# Patient Record
Sex: Male | Born: 2000 | Race: White | Hispanic: No | Marital: Single | Smoking: Current every day smoker
Health system: Southern US, Community
[De-identification: ages and names within clinical notes are randomized; demographics above are authoritative.]

## PROBLEM LIST (undated history)

## (undated) DIAGNOSIS — F1290 Cannabis use, unspecified, uncomplicated: Secondary | ICD-10-CM

## (undated) DIAGNOSIS — J45909 Unspecified asthma, uncomplicated: Secondary | ICD-10-CM

## (undated) DIAGNOSIS — L309 Dermatitis, unspecified: Secondary | ICD-10-CM

## (undated) DIAGNOSIS — K4090 Unilateral inguinal hernia, without obstruction or gangrene, not specified as recurrent: Secondary | ICD-10-CM

## (undated) DIAGNOSIS — S62109A Fracture of unspecified carpal bone, unspecified wrist, initial encounter for closed fracture: Secondary | ICD-10-CM

## (undated) HISTORY — PX: NOSE SURGERY: SHX723

## (undated) HISTORY — DX: Unspecified asthma, uncomplicated: J45.909

---
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
## 2019-05-24 IMAGING — DX DG TIBIA/FIBULA 2V*L*
2 series · 2 of 2 positions shown · non-contrast
Comparison: 01/10/2014.

CLINICAL DATA: Swelling.  Evaluate for stress fracture.

EXAM:
LEFT TIBIA AND FIBULA - 2 VIEW

[tibia ap]
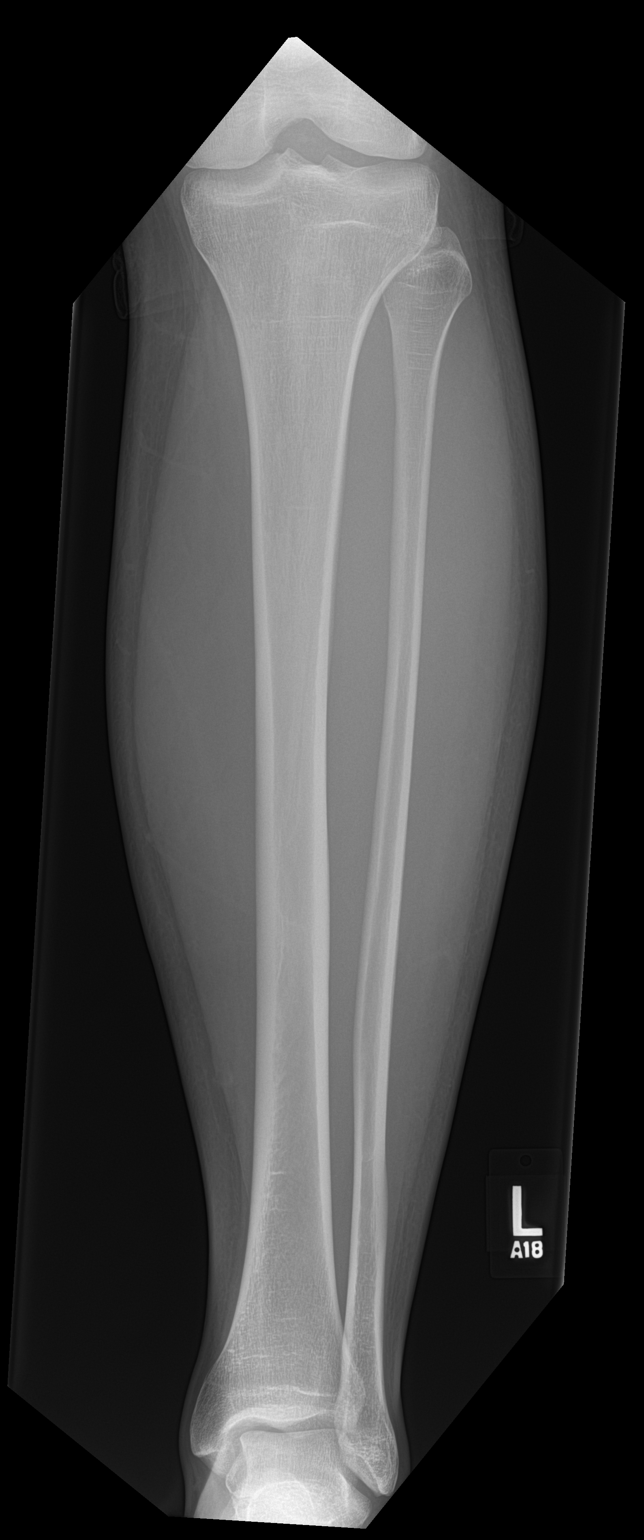

[tibia lat]
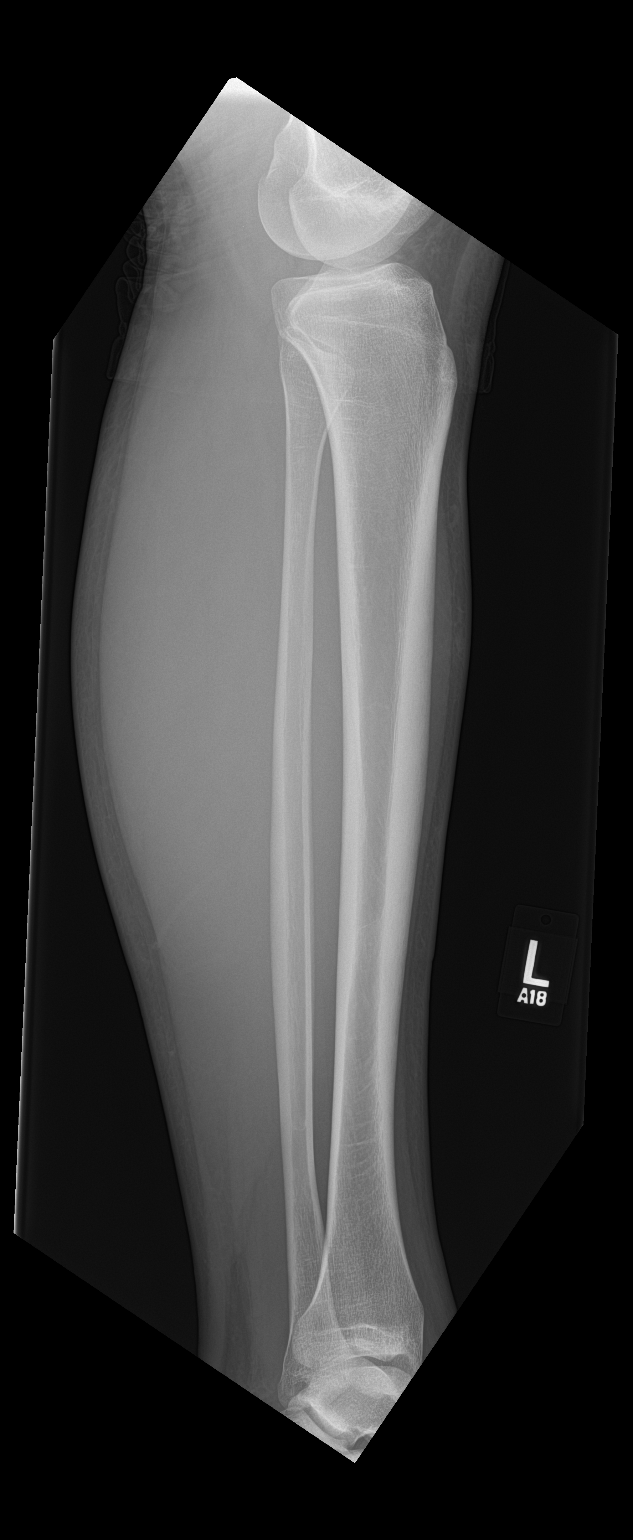

[2 of 2 positions shown; findings below may reference images not displayed]

FINDINGS: No acute bony or joint abnormality identified. No evidence of
fracture or dislocation. No focal bony abnormality. No radiopaque
foreign body.
IMPRESSION: No acute abnormality.

## 2019-05-31 ENCOUNTER — Ambulatory Visit
Admission: EM | Admit: 2019-05-31 | Discharge: 2019-05-31 | Disposition: A | Discharge status: Home / Self Care | Attending: Emergency Medicine | Admitting: Emergency Medicine

## 2019-05-31 ENCOUNTER — Ambulatory Visit (INDEPENDENT_AMBULATORY_CARE_PROVIDER_SITE_OTHER)

## 2019-05-31 ENCOUNTER — Other Ambulatory Visit: Payer: Self-pay

## 2019-05-31 DIAGNOSIS — R05 Cough: Secondary | ICD-10-CM

## 2019-05-31 DIAGNOSIS — J4521 Mild intermittent asthma with (acute) exacerbation: Secondary | ICD-10-CM

## 2019-05-31 DIAGNOSIS — Z720 Tobacco use: Secondary | ICD-10-CM

## 2019-05-31 DIAGNOSIS — R0602 Shortness of breath: Secondary | ICD-10-CM

## 2019-05-31 MED ORDER — PREDNISONE 20 MG PO TABS: 20.0000 mg | ORAL_TABLET | Freq: Two times a day (BID) | ORAL | 0 refills | Status: AC

## 2019-05-31 NOTE — ED Triage Notes (Signed)
Pt states that he has had sob with right chest discomfort, tested for covid on Friday and was negative

## 2019-05-31 NOTE — Discharge Instructions (Signed)
We are unable to rule out cardiac cause or blood clot in the Urgent care setting at this time.  If you are concerned that something is wrong with your heart or that you have a blood clot you should go to the ED for further work-up and evaluation.   X-rays did not show lung collapse and showed good lung markings in right lung Exam and vital signs were reassuring on exam Symptoms may be secondary to underlying asthma, smoking, and/or anxiety We will treat you possible asthma flare based on your symptoms Continue with rescue inhaler as needed for shortness of breath and/or wheezing Prednisone prescribed.  Take as directed and to completion Get plenty of rest and push fluids Please follow up with your PCP next week for recheck and to ensure your symptoms are improving.  If you are struggling with nicotine addiction you should talk to your doctor about nicotine patches or medications that may help you quit.  Smoking tobacco may put you at risk for developing lung cancer later in life.  It may also make your asthma worse.   Return or go to ER if you have any new or worsening symptoms such as fever, chills, fatigue, worsening shortness of breath, wheezing, chest pain, nausea, vomiting, changes in bowel or bladder habits, etc..Marland Kitchen

## 2019-05-31 NOTE — ED Provider Notes (Signed)
[place]   [id number] 05/31/19 Arrival Time: 2355  Cc:  SOB  SUBJECTIVE:  Leon Farmer is a 18 y.o. male hx significant for asthma, and tobacco use, smokes 3 black-n-milds/ per day x 2 years, who presents with intermittent subjective SOB with activity and his right lung "not feeling right," x 4-5 days. Does admit to smoking marijuana prior to symptoms, denies other recreational drug use. Denies sick exposure to COVID, flu or strep.  Denies recent travel.  Was tested for COVID on Friday, which was negative.  Has been using rescue inhaler with relief.  Symptoms are made worse at night and with activity.   Denies previous symptoms in the past.  Complains of associated wheezing.  Denies fever, chills, fatigue, sinus pain, congestion, rhinorrhea, sore throat, chest pain, nausea, changes in bowel or bladder habits.    Denies hx of blood clots, recent long travel, calf pain, hormone or steroid use.    ROS: As per HPI.  Past Medical History:  Diagnosis Date  . Asthma   . Asthma   . Fracture of wrist    Past Surgical History:  Procedure Laterality Date  . NOSE SURGERY     Allergies  Allergen Reactions  . Shrimp [Shellfish Allergy]    No current facility-administered medications on file prior to encounter.    Current Outpatient Medications on File Prior to Encounter  Medication Sig Dispense Refill  . albuterol (PROVENTIL HFA;VENTOLIN HFA) 108 (90 BASE) MCG/ACT inhaler Inhale 2 puffs into the lungs every 6 (six) hours as needed. Shortness of breath    . albuterol (PROVENTIL) (2.5 MG/3ML) 0.083% nebulizer solution Take 2.5 mg by nebulization every 6 (six) hours as needed. For shortness of breath       Social History   Socioeconomic History  . Marital status: Single    Spouse name: Not on file  . Number of children: Not on file  . Years of education: Not on file  . Highest education level: Not on file  Occupational History  . Not on file  Social Needs  . Financial  resource strain: Not on file  . Food insecurity    Worry: Not on file    Inability: Not on file  . Transportation needs    Medical: Not on file    Non-medical: Not on file  Tobacco Use  . Smoking status: Current Some Day Smoker  . Smokeless tobacco: Never Used  Substance and Sexual Activity  . Alcohol use: No  . Drug use: No  . Sexual activity: Not on file  Lifestyle  . Physical activity    Days per week: Not on file    Minutes per session: Not on file  . Stress: Not on file  Relationships  . Social Herbalist on phone: Not on file    Gets together: Not on file    Attends religious service: Not on file    Active member of club or organization: Not on file    Attends meetings of clubs or organizations: Not on file    Relationship status: Not on file  . Intimate partner violence    Fear of current or ex partner: Not on file    Emotionally abused: Not on file    Physically abused: Not on file    Forced sexual activity: Not on file  Other Topics Concern  . Not on file  Social History Narrative  . Not on file   Family History  Problem Relation  Age of Onset  . Heart disease Other   . Diabetes Other      OBJECTIVE:  Vitals:   05/31/19 1509  BP: 138/81  Pulse: 85  Resp: 20  Temp: 98.3 F (36.8 C)  SpO2: 96%    General appearance: AOx3 to person, place and recent holiday, well-appearing, nontoxic; speaking in full sentences without difficulty HEENT:NCAT; Ears: EACs clear, TMs pearly gray; Eyes: PERRL.  EOM grossly intact. Nose: nares patent without rhinorrhea; Throat: tonsils nonerythematous or enlarged, uvula midline  Neck: supple without LAD Lungs: clear to auscultation bilaterally without adventitious breath sounds; normal respiratory effort; cough absent; good breath sounds throughout bilateral lung fields Heart: regular rate and rhythm.  Radial pulses 2+ symmetrical bilaterally Skin: warm and dry Psychological: alert and cooperative; anxious mood and  affect; pressured speech  DIAGNOSTIC STUDIES:  Dg Chest 2 View  Result Date: 05/31/2019 CLINICAL DATA:  Cough and shortness of breath for 6 days. EXAM: CHEST - 2 VIEW COMPARISON:  PA and lateral chest 11/03/2010. FINDINGS: The lungs are clear. Heart size is normal. No pneumothorax or pleural fluid. No bony abnormality. IMPRESSION: Normal chest. Electronically Signed   By: Drusilla Kannerhomas  Dalessio M.D.   On: 05/31/2019 16:12     ASSESSMENT & PLAN:  1. Mild intermittent asthma with exacerbation   2. Shortness of breath     Meds ordered this encounter  Medications  . predniSONE (DELTASONE) 20 MG tablet    Sig: Take 1 tablet (20 mg total) by mouth 2 (two) times daily with a meal for 5 days.    Dispense:  10 tablet    Refill:  0    Order Specific Question:   Supervising Provider    Answer:   Eustace MooreELSON, YVONNE SUE [[id number]]    Orders Placed This Encounter  Procedures  . DG Chest 2 View    Standing Status:   Standing    Number of Occurrences:   1    Order Specific Question:   Reason for Exam (SYMPTOM  OR DIAGNOSIS REQUIRED)    Answer:   subjective SOB x 6 days    We are unable to rule out cardiac cause or blood clot in the Urgent care setting at this time.  If you are concerned that something is wrong with your heart or that you have a blood clot you should go to the ED for further work-up and evaluation.   X-rays did not show lung collapse and showed good lung markings in right lung Exam and vital signs were reassuring on exam Symptoms may be secondary to underlying asthma, smoking, and/or anxiety We will treat you for possible asthma flare based on your symptoms Continue with rescue inhaler as needed for shortness of breath and/or wheezing Prednisone prescribed.  Take as directed and to completion Get plenty of rest and push fluids Please follow up with your PCP next week for recheck and to ensure your symptoms are improving.  If you are struggling with nicotine addiction you should talk to  your doctor about nicotine patches or medications that may help you quit.  Smoking tobacco may put you at risk for developing lung cancer later in life.  It may also make your asthma worse.   Return or go to ER if you have any new or worsening symptoms such as fever, chills, fatigue, worsening shortness of breath, wheezing, chest pain, nausea, vomiting, changes in bowel or bladder habits, etc...  Reviewed expectations re: course of current medical issues. Questions answered. Outlined signs  and symptoms indicating need for more acute intervention. Patient verbalized understanding. After Visit Summary given.          Rennis HardingWurst, Shereta Crothers, PA-C 05/31/19 1630

## 2021-07-27 ENCOUNTER — Other Ambulatory Visit: Payer: Self-pay

## 2021-07-27 ENCOUNTER — Encounter: Payer: Self-pay | Admitting: *Deleted

## 2021-07-27 ENCOUNTER — Emergency Department
Admission: EM | Admit: 2021-07-27 | Discharge: 2021-07-27 | Disposition: A | Discharge status: Home / Self Care | Payer: Self-pay | Attending: Emergency Medicine | Admitting: Emergency Medicine

## 2021-07-27 DIAGNOSIS — R1032 Left lower quadrant pain: Secondary | ICD-10-CM | POA: Insufficient documentation

## 2021-07-27 DIAGNOSIS — R0789 Other chest pain: Secondary | ICD-10-CM | POA: Insufficient documentation

## 2021-07-27 DIAGNOSIS — R110 Nausea: Secondary | ICD-10-CM | POA: Insufficient documentation

## 2021-07-27 DIAGNOSIS — F17200 Nicotine dependence, unspecified, uncomplicated: Secondary | ICD-10-CM | POA: Insufficient documentation

## 2021-07-27 DIAGNOSIS — J45909 Unspecified asthma, uncomplicated: Secondary | ICD-10-CM | POA: Insufficient documentation

## 2021-07-27 LAB — CBC
HCT: 43.5 % (ref 39.0–52.0)
Hemoglobin: 15.7 g/dL (ref 13.0–17.0)
MCH: 35.1 pg — ABNORMAL HIGH (ref 26.0–34.0)
MCHC: 36.1 g/dL — ABNORMAL HIGH (ref 30.0–36.0)
MCV: 97.3 fL (ref 80.0–100.0)
Platelets: 205 10*3/uL (ref 150–400)
RBC: 4.47 MIL/uL (ref 4.22–5.81)
RDW: 11.7 % (ref 11.5–15.5)
WBC: 6.4 10*3/uL (ref 4.0–10.5)
nRBC: 0 % (ref 0.0–0.2)

## 2021-07-27 LAB — COMPREHENSIVE METABOLIC PANEL
ALT: 24 U/L (ref 0–44)
AST: 23 U/L (ref 15–41)
Albumin: 4.9 g/dL (ref 3.5–5.0)
Alkaline Phosphatase: 66 U/L (ref 38–126)
Anion gap: 8 (ref 5–15)
BUN: 15 mg/dL (ref 6–20)
CO2: 26 mmol/L (ref 22–32)
Calcium: 9.5 mg/dL (ref 8.9–10.3)
Chloride: 102 mmol/L (ref 98–111)
Creatinine, Ser: 0.9 mg/dL (ref 0.61–1.24)
GFR, Estimated: >60 mL/min (ref >60)
Glucose, Bld: 120 mg/dL — ABNORMAL HIGH (ref 70–99)
Potassium: 3.8 mmol/L (ref 3.5–5.1)
Sodium: 136 mmol/L (ref 135–145)
Total Bilirubin: 0.9 mg/dL (ref 0.3–1.2)
Total Protein: 8.1 g/dL (ref 6.5–8.1)

## 2021-07-27 LAB — URINALYSIS, COMPLETE (UACMP) WITH MICROSCOPIC
Bilirubin Urine: NEGATIVE
Glucose, UA: NEGATIVE mg/dL
Hgb urine dipstick: NEGATIVE
Ketones, ur: NEGATIVE mg/dL
Leukocytes,Ua: NEGATIVE
Nitrite: NEGATIVE
Protein, ur: NEGATIVE mg/dL
Specific Gravity, Urine: 1.015 (ref 1.005–1.030)
pH: 6.5 (ref 5.0–8.0)

## 2021-07-27 LAB — LIPASE, BLOOD: Lipase: 27 U/L (ref 11–51)

## 2021-07-27 NOTE — ED Triage Notes (Signed)
Patient c/o abdominal pain that has been present for 3 days. Patient also c/o swelling in groin and left side chest that he is concerned that it could a hernia. Patient c/o pain with urination.

## 2021-07-27 NOTE — ED Provider Notes (Signed)
[facility] Emergency Department Provider Note   ____________________________________________   Event Date/Time   First MD Initiated Contact with Patient 07/27/21 2001     (approximate)  I have reviewed the triage vital signs and the nursing notes.   HISTORY  Chief Complaint Abdominal Pain    HPI Leon Farmer is a 20 y.o. male who presents for left-sided chest wall pain and left inguinal pain that began earlier today  LOCATION: Left lower chest wall and left inguinal region DURATION: Began earlier today TIMING: Resolved since onset SEVERITY: Acute, severe QUALITY: Aching/tingling in the chest CONTEXT: Patient states that he was lifting heavy bus trays at work when he noticed a sharp pain to the left inguinal region and felt a small mass that was easily reducible and did not recur.  Patient states that approximately 1 hour later he began felt a mass to the left lower anterior chest wall that he pressed on and went away but subsequently gave him 10/10 tingling sensation and pain over this area MODIFYING FACTORS: Palpation worsens these pains and time alleviated them ASSOCIATED SYMPTOMS: Mild nausea   Per medical record review patient has history of asthma          Past Medical History:  Diagnosis Date   Asthma    Asthma    Fracture of wrist     Patient Active Problem List   Diagnosis Date Noted   Distal radius fracture 10/02/2014   Contusion of knee, right 04/06/2014    Past Surgical History:  Procedure Laterality Date   NOSE SURGERY      Prior to Admission medications   Medication Sig Start Date End Date Taking? Authorizing Provider  albuterol (PROVENTIL HFA;VENTOLIN HFA) 108 (90 BASE) MCG/ACT inhaler Inhale 2 puffs into the lungs every 6 (six) hours as needed. Shortness of breath    [provider]  albuterol (PROVENTIL) (2.5 MG/3ML) 0.083% nebulizer solution Take 2.5 mg by nebulization every 6 (six) hours as needed.  For shortness of breath     [provider]    Allergies Shrimp [shellfish allergy]  Family History  Problem Relation Age of Onset   Heart disease Other    Diabetes Other     Social History Social History   Tobacco Use   Smoking status: Some Days   Smokeless tobacco: Never  Substance Use Topics   Alcohol use: No   Drug use: Yes    Types: Marijuana    Review of Systems Constitutional: No fever/chills Eyes: No visual changes. ENT: No sore throat. Cardiovascular: Endorses chest pain. Respiratory: Denies shortness of breath. Gastrointestinal: Endorses left inguinal abdominal pain.  No nausea, no vomiting.  No diarrhea. Genitourinary: Negative for dysuria. Musculoskeletal: Negative for acute arthralgias Skin: Negative for rash. Neurological: Negative for headaches, weakness/numbness/paresthesias in any extremity Psychiatric: Negative for suicidal ideation/homicidal ideation   ____________________________________________   PHYSICAL EXAM:  VITAL SIGNS: ED Triage Vitals  Enc Vitals Group     BP 07/27/21 1807 126/74     Pulse Rate 07/27/21 1807 71     Resp 07/27/21 1807 16     Temp 07/27/21 1807 97.9 F (36.6 C)     Temp Source 07/27/21 1807 Oral     SpO2 07/27/21 1807 98 %     Weight 07/27/21 1809 200 lb (90.7 kg)     Height 07/27/21 1809 6\' 2"  (1.88 m)     Head Circumference --      Peak Flow --  Pain Score 07/27/21 1808 4     Pain Loc --      Pain Edu? --      Excl. in GC? --    Constitutional: Alert and oriented. Well appearing and in no acute distress. Eyes: Conjunctivae are normal. PERRL. Head: Atraumatic. Nose: No congestion/rhinnorhea. Mouth/Throat: Mucous membranes are moist. Neck: No stridor Cardiovascular: Grossly normal heart sounds.  Good peripheral circulation. Respiratory: Normal respiratory effort.  No retractions. Gastrointestinal: Soft and nontender. No distention.  No obvious inguinal hernia Musculoskeletal: No obvious  deformities Neurologic:  Normal speech and language. No gross focal neurologic deficits are appreciated. Skin:  Skin is warm and dry. No rash noted. Psychiatric: Mood and affect are normal. Speech and behavior are normal.  ____________________________________________   LABS (all labs ordered are listed, but only abnormal results are displayed)  Labs Reviewed  COMPREHENSIVE METABOLIC PANEL - Abnormal; Notable for the following components:      Result Value   Glucose, Bld 120 (*)    All other components within normal limits  CBC - Abnormal; Notable for the following components:   MCH 35.1 (*)    MCHC 36.1 (*)    All other components within normal limits  URINALYSIS, COMPLETE (UACMP) WITH MICROSCOPIC - Abnormal; Notable for the following components:   Bacteria, UA RARE (*)    All other components within normal limits  LIPASE, BLOOD    PROCEDURES  Procedure(s) performed (including Critical Care):  .1-3 Lead EKG Interpretation  Date/Time: 07/27/2021 10:04 PM Performed by: Merwyn Katos, MD Authorized by: Merwyn Katos, MD     Interpretation: normal     ECG rate:  71   ECG rate assessment: normal     Rhythm: sinus rhythm     Ectopy: none     Conduction: normal     ____________________________________________   INITIAL IMPRESSION / ASSESSMENT AND PLAN / ED COURSE  As part of my medical decision making, I reviewed the following data within the electronic medical record, if available:  Nursing notes reviewed and incorporated, Labs reviewed, EKG interpreted, Old chart reviewed, Radiograph reviewed and Notes from prior ED visits reviewed and incorporated      Patient presents for abdominal pain.  Differential diagnosis includes appendicitis, abdominal aortic aneurysm, surgical biliary disease, pancreatitis, SBO, mesenteric ischemia, serious intra-abdominal bacterial illness, genital torsion. Doubt atypical ACS. Based on history, physical exam, radiologic/laboratory  evaluation, there is no red flag results or symptomatology requiring emergent intervention or need for admission at this time Pt tolerating PO. Disposition: Patient will be discharged with strict return precautions and follow up with primary MD within 12-24 hours for further evaluation. Patient understands that this still may have an early presentation of an emergent medical condition such as appendicitis that will require a recheck.      ____________________________________________   FINAL CLINICAL IMPRESSION(S) / ED DIAGNOSES  Final diagnoses:  Left lower quadrant abdominal pain  Left-sided chest wall pain     ED Discharge Orders     None        Note:  This document was prepared using Dragon voice recognition software and may include unintentional dictation errors.    Merwyn Katos, MD 07/27/21 2204

## 2021-09-18 ENCOUNTER — Other Ambulatory Visit: Payer: Self-pay

## 2021-09-18 ENCOUNTER — Encounter: Payer: Self-pay | Admitting: Emergency Medicine

## 2021-09-18 ENCOUNTER — Emergency Department: Payer: Self-pay

## 2021-09-18 ENCOUNTER — Emergency Department
Admission: EM | Admit: 2021-09-18 | Discharge: 2021-09-18 | Disposition: A | Discharge status: Home / Self Care | Payer: Self-pay | Attending: Emergency Medicine | Admitting: Emergency Medicine

## 2021-09-18 DIAGNOSIS — F17200 Nicotine dependence, unspecified, uncomplicated: Secondary | ICD-10-CM | POA: Insufficient documentation

## 2021-09-18 DIAGNOSIS — J45909 Unspecified asthma, uncomplicated: Secondary | ICD-10-CM | POA: Insufficient documentation

## 2021-09-18 DIAGNOSIS — N5082 Scrotal pain: Secondary | ICD-10-CM

## 2021-09-18 DIAGNOSIS — N433 Hydrocele, unspecified: Secondary | ICD-10-CM

## 2021-09-18 DIAGNOSIS — N5089 Other specified disorders of the male genital organs: Secondary | ICD-10-CM

## 2021-09-18 LAB — COMPREHENSIVE METABOLIC PANEL
ALT: 24 U/L (ref 0–44)
AST: 23 U/L (ref 15–41)
Albumin: 4.7 g/dL (ref 3.5–5.0)
Alkaline Phosphatase: 50 U/L (ref 38–126)
Anion gap: 9 (ref 5–15)
BUN: 13 mg/dL (ref 6–20)
CO2: 28 mmol/L (ref 22–32)
Calcium: 9.7 mg/dL (ref 8.9–10.3)
Chloride: 101 mmol/L (ref 98–111)
Creatinine, Ser: 0.99 mg/dL (ref 0.61–1.24)
GFR, Estimated: >60 mL/min (ref >60)
Glucose, Bld: 100 mg/dL — ABNORMAL HIGH (ref 70–99)
Potassium: 3.8 mmol/L (ref 3.5–5.1)
Sodium: 138 mmol/L (ref 135–145)
Total Bilirubin: 0.7 mg/dL (ref 0.3–1.2)
Total Protein: 7.4 g/dL (ref 6.5–8.1)

## 2021-09-18 LAB — URINALYSIS, COMPLETE (UACMP) WITH MICROSCOPIC
Bacteria, UA: NONE SEEN
Bilirubin Urine: NEGATIVE
Glucose, UA: NEGATIVE mg/dL
Hgb urine dipstick: NEGATIVE
Ketones, ur: NEGATIVE mg/dL
Leukocytes,Ua: NEGATIVE
Nitrite: NEGATIVE
Protein, ur: NEGATIVE mg/dL
Specific Gravity, Urine: 1.002 — ABNORMAL LOW (ref 1.005–1.030)
Squamous Epithelial / HPF: NONE SEEN (ref 0–5)
WBC, UA: NONE SEEN WBC/hpf (ref 0–5)
pH: 8 (ref 5.0–8.0)

## 2021-09-18 LAB — CBC
HCT: 45.7 % (ref 39.0–52.0)
Hemoglobin: 16.2 g/dL (ref 13.0–17.0)
MCH: 34.2 pg — ABNORMAL HIGH (ref 26.0–34.0)
MCHC: 35.4 g/dL (ref 30.0–36.0)
MCV: 96.4 fL (ref 80.0–100.0)
Platelets: 197 10*3/uL (ref 150–400)
RBC: 4.74 MIL/uL (ref 4.22–5.81)
RDW: 11.4 % — ABNORMAL LOW (ref 11.5–15.5)
WBC: 5.5 10*3/uL (ref 4.0–10.5)
nRBC: 0 % (ref 0.0–0.2)

## 2021-09-18 LAB — LIPASE, BLOOD: Lipase: 31 U/L (ref 11–51)

## 2021-09-18 MED ORDER — IBUPROFEN 600 MG PO TABS
600.0000 mg | ORAL_TABLET | Freq: Once | ORAL | Status: AC
  Administered 2021-09-18: 600 mg via ORAL
  Filled 2021-09-18: qty 1

## 2021-09-18 MED ORDER — ACETAMINOPHEN 500 MG PO TABS
1000.0000 mg | ORAL_TABLET | Freq: Once | ORAL | Status: AC
  Administered 2021-09-18: 1000 mg via ORAL
  Filled 2021-09-18: qty 2

## 2021-09-18 NOTE — Discharge Instructions (Addendum)
Please take Tylenol and ibuprofen/Advil for your pain.  It is safe to take them together, or to alternate them every few hours.  Take up to 1000mg  of Tylenol at a time, up to 4 times per day.  Do not take more than 4000 mg of Tylenol in 24 hours.  For ibuprofen, take 400-600 mg, 4-5 times per day.  Follow-up with Dr. , or one of her colleagues, in the clinic to discuss the hydroceles seen on your ultrasound today.  If you develop any fevers, difficulty or inability to urinate, burning with urination or worsening abdominal pain, please return to the ED.

## 2021-09-18 NOTE — ED Notes (Signed)
Pt states that he was diagnosed with a poss. Hernia a month and a half ago. Pt states that he has been having pain in his llq and states that this am he noticed when he went to the bathroom that he was having some bulging to his bilat inguinal canals

## 2021-09-18 NOTE — ED Provider Notes (Signed)
[facility] Emergency Department Provider Note ____________________________________________   Event Date/Time   First MD Initiated Contact with Patient 09/18/21 1357     (approximate)  I have reviewed the triage vital signs and the nursing notes.  HISTORY  Chief Complaint Hernia   HPI Leon Farmer is a 20 y.o. malewho presents to the ED for evaluation of possible hernia.   Chart review indicates no relevant hx.   Patient presents to the ED for the ration of intermittent pain to his left scrotum inguinal canal over the past 1-2 weeks.  Denies any falls, trauma or injuries.  Reports pain to his left-sided scrotum, radiating up towards his left inguinal canal.  Denies any abdominal pain, emesis, dysuria, penile discharge or penile/testicular lesions.  Reports moderate to severe pain, up to 6/10 intensity.  This is never happened before.  Reports feeling a palpable soft mass superior posterior to his left-sided testicle.  Past Medical History:  Diagnosis Date   Asthma    Asthma    Fracture of wrist     Patient Active Problem List   Diagnosis Date Noted   Distal radius fracture 10/02/2014   Contusion of knee, right 04/06/2014    Past Surgical History:  Procedure Laterality Date   NOSE SURGERY      Prior to Admission medications   Medication Sig Start Date End Date Taking? Authorizing Provider  albuterol (PROVENTIL HFA;VENTOLIN HFA) 108 (90 BASE) MCG/ACT inhaler Inhale 2 puffs into the lungs every 6 (six) hours as needed. Shortness of breath    [provider]  albuterol (PROVENTIL) (2.5 MG/3ML) 0.083% nebulizer solution Take 2.5 mg by nebulization every 6 (six) hours as needed. For shortness of breath     [provider]    Allergies Shrimp [shellfish allergy]  Family History  Problem Relation Age of Onset   Heart disease Other    Diabetes Other     Social History Social History   Tobacco Use   Smoking status: Some  Days   Smokeless tobacco: Never  Substance Use Topics   Alcohol use: No   Drug use: Yes    Types: Marijuana    Review of Systems  Constitutional: No fever/chills Eyes: No visual changes. ENT: No sore throat. Cardiovascular: Denies chest pain. Respiratory: Denies shortness of breath. Gastrointestinal: No abdominal pain.  No nausea, no vomiting.  No diarrhea.  No constipation. Genitourinary: Negative for dysuria. Positive for left-sided scrotal pain. Musculoskeletal: Negative for back pain. Skin: Negative for rash. Neurological: Negative for headaches, focal weakness or numbness.  ____________________________________________   PHYSICAL EXAM:  VITAL SIGNS: Vitals:   09/18/21 1524 09/18/21 1617  BP: 129/69 119/66  Pulse: (!) 52 (!) 55  Resp: 16 16  Temp: (!) 97.5 F (36.4 C) 97.9 F (36.6 C)  SpO2: 96% 98%     Constitutional: Alert and oriented. Well appearing and in no acute distress. Eyes: Conjunctivae are normal. PERRL. EOMI. Head: Atraumatic. Nose: No congestion/rhinnorhea. Mouth/Throat: Mucous membranes are moist.  Oropharynx non-erythematous. Neck: No stridor. No cervical spine tenderness to palpation. Cardiovascular: Normal rate, regular rhythm. Grossly normal heart sounds.  Good peripheral circulation. Respiratory: Normal respiratory effort.  No retractions. Lungs CTAB. Gastrointestinal: Soft , nondistended, nontender to palpation. No CVA tenderness.  Benign abdominal examination. Bilateral inguinal canals are without masses, skin changes or tenderness. Normal-appearing external genitalia without skin lesions.  Mild and poorly localizing tenderness to left-sided scrotum.  Soft palpable mass is noted to superior left-sided scrotum, reminiscent of  a varicocele. Bilateral testicles are smooth without nodularity, masses or tenderness. Musculoskeletal: No lower extremity tenderness nor edema.  No joint effusions. No signs of acute trauma. Neurologic:  Normal speech  and language. No gross focal neurologic deficits are appreciated. No gait instability noted. Skin:  Skin is warm, dry and intact. No rash noted. Psychiatric: Mood and affect are normal. Speech and behavior are normal.  ____________________________________________   LABS (all labs ordered are listed, but only abnormal results are displayed)  Labs Reviewed  COMPREHENSIVE METABOLIC PANEL - Abnormal; Notable for the following components:      Result Value   Glucose, Bld 100 (*)    All other components within normal limits  CBC - Abnormal; Notable for the following components:   MCH 34.2 (*)    RDW 11.4 (*)    All other components within normal limits  URINALYSIS, COMPLETE (UACMP) WITH MICROSCOPIC - Abnormal; Notable for the following components:   Color, Urine COLORLESS (*)    APPearance CLEAR (*)    Specific Gravity, Urine 1.002 (*)    All other components within normal limits  LIPASE, BLOOD   ____________________________________________  12 Lead EKG   ____________________________________________  RADIOLOGY  ED MD interpretation: Scrotal ultrasound reviewed by me  Official radiology report(s): US SCROTUM W/DOPPLER  Result Date: 09/18/2021 CLINICAL DATA:  Left testicular swelling EXAM: SCROTAL ULTRASOUND DOPPLER ULTRASOUND OF THE TESTICLES TECHNIQUE: Complete ultrasound examination of the testicles, epididymis, and other scrotal structures was performed. Color and spectral Doppler ultrasound were also utilized to evaluate blood flow to the testicles. COMPARISON:  None. FINDINGS: Right testicle Measurements: 5 x 2.8 x 2.4 cm. No mass or microlithiasis visualized. Left testicle Measurements: 5.3 x 3.1 x 2.1 cm. No mass or microlithiasis visualized. Right epididymis:  Normal in size.  2 mm epididymal cyst. Left epididymis:  Normal in size and appearance. Hydrocele:  Small bilateral hydroceles. Varicocele:  None visualized. Pulsed Doppler interrogation of both testes demonstrates  normal low resistance arterial and venous waveforms bilaterally. IMPRESSION: 1. Normal size and ultrasound appearance of the bilateral testicles. No evidence of testicular torsion. 2. Small bilateral hydroceles. Electronically Signed   By: Maudry Mayhew M.D.   On: 09/18/2021 15:39    ____________________________________________   PROCEDURES and INTERVENTIONS  Procedure(s) performed (including Critical Care):  Procedures  Medications  acetaminophen (TYLENOL) tablet 1,000 mg (1,000 mg Oral Given 09/18/21 1443)  ibuprofen (ADVIL) tablet 600 mg (600 mg Oral Given 09/18/21 1443)    ____________________________________________   MDM / ED COURSE   20 year old male presents to the ED with subacute left-sided scrotal pain, with evidence of small hydrocele, and amenable to outpatient management.  Normal vitals and patient looks overall well.  Benign abdominal examination.  GU exam does not demonstrate any evidence of skin lesions, trauma.  Initially thought he felt some swelling consistent with a varicocele, but ultrasound does not show any evidence of this.  Does have some evidence of bilateral hydroceles that are small.  Overall looks well and no barriers to outpatient management.  Blood work is normal and urine without infectious features.  We will discharge with urology referral.  Clinical Course as of 09/18/21 1643  Wed Sep 18, 2021  1454 Discussed the patient my suspicion for a varicocele and we discussed what this is.  We discussed ultrasound of his scrotum to confirm and to assess for additional pathology.  He is in agreement. [DS]  1559 Reassessed.  Patient reports improved pain with oral analgesics.  We discussed ultrasound  results and following up with urology.  We discussed return precautions. [DS]    Clinical Course User Index [DS] Delton Prairie, MD    ____________________________________________   FINAL CLINICAL IMPRESSION(S) / ED DIAGNOSES  Final diagnoses:  Scrotal pain   Hydrocele in adult     ED Discharge Orders     None        Jeffrey Voth   Note:  This document was prepared using Dragon voice recognition software and may include unintentional dictation errors.    Delton Prairie, MD 09/18/21 [phone]

## 2021-09-18 NOTE — ED Triage Notes (Signed)
Pt comes into the ED via POV c/o hernia on the left groin that is now causing swelling in his scrotum.  Pt ambulatory to triage at this time.  Pt has some discomfort with urination.

## 2021-09-30 NOTE — Progress Notes (Signed)
10/01/21 8:51 AM   Leon Farmer 2001-07-29 [id number]  Referring provider:  Elfredia Nevins, MD [address] [place],  [place] [address] Chief Complaint  Patient presents with   Hydrocele     HPI: Leon Farmer is a 20 y.o.male who presents today for further evaluation of scrotal pain.  He was seen in the ED on 09/18/2021 for intermittent left scrotum pain in the inguinal canal associated with a palpable soft mass. Urinalysis was unremarkable. Scrotal ultrasound revealed no evidence of testicular torsion and small bilateral hydroceles. He was given oral analgesics which improved his symptoms.   Notably, he was also seen in the emergency room with left lower quadrant pain on 07/2021 with left lower quadrant pain which was felt to be possibly hernia.  His scrotal pain was off-and-on since then but over the past 2 weeks, has progressed.  He reports that during the past two days his pain was severe compared to previous day. He noticed he could see is veins and they were very large.  It was initially only on the left side but now he reports he feels a similar feeling but less significant on the right.  He denies any penile discharge, or any exposure to STIs. He reports that he sees "bubbles" in his urine.   He has been constipation recently.  He also has bloating and gas at times.  He denies any penile discharge, or any exposure to STIs. He reports that he sees "bubbles" in his urine.     PMH: Past Medical History:  Diagnosis Date   Asthma    Asthma    Fracture of wrist     Surgical History: Past Surgical History:  Procedure Laterality Date   NOSE SURGERY      Home Medications:  Allergies as of 10/01/2021       Reactions   Shrimp [shellfish Allergy]         Medication List        Accurate as of October 01, 2021  8:51 AM. If you have any questions, ask your nurse or doctor.          STOP taking these medications    Advair HFA 115-21 MCG/ACT  inhaler Generic drug: fluticasone-salmeterol Stopped by: Vanna Scotland, MD   albuterol (2.5 MG/3ML) 0.083% nebulizer solution Commonly known as: PROVENTIL Stopped by: Vanna Scotland, MD   albuterol 108 (90 Base) MCG/ACT inhaler Commonly known as: VENTOLIN HFA Stopped by: Vanna Scotland, MD   omeprazole 40 MG capsule Commonly known as: PRILOSEC Stopped by: Vanna Scotland, MD        Allergies:  Allergies  Allergen Reactions   Shrimp [Shellfish Allergy]     Family History: Family History  Problem Relation Age of Onset   Heart disease Other    Diabetes Other     Social History:  reports that he has been smoking. He has never used smokeless tobacco. He reports current drug use. Drug: Marijuana. He reports that he does not drink alcohol.   Physical Exam: BP 126/77   Pulse 66   Ht 6\' 2"  (1.88 m)   Wt 199 lb (90.3 kg)   BMI 25.55 kg/m   Constitutional:  Alert and oriented, No acute distress. HEENT:  AT, moist mucus membranes.  Trachea midline, no masses. Cardiovascular: No clubbing, cyanosis, or edema. Respiratory: Normal respiratory effort, no increased work of breathing. GU: CVA tenderness, bilateral descended testicles without masses, no appreciable or clinical hydrocele or varicocele appreciated.  Normal epididymis.  Bilateral inguinal canals normal without palpable hernia. Skin: No rashes, bruises or suspicious lesions. Neurologic: Grossly intact, no focal deficits, moving all 4 extremities. Psychiatric: Normal mood and affect.  Laboratory Data:  Lab Results  Component Value Date   CREATININE 0.99 09/18/2021     Pertinent Imaging: CLINICAL DATA:  Left testicular swelling   EXAM: SCROTAL ULTRASOUND   DOPPLER ULTRASOUND OF THE TESTICLES   TECHNIQUE: Complete ultrasound examination of the testicles, epididymis, and other scrotal structures was performed. Color and spectral Doppler ultrasound were also utilized to evaluate blood flow to  the testicles.   COMPARISON:  None.   FINDINGS: Right testicle   Measurements: 5 x 2.8 x 2.4 cm. No mass or microlithiasis visualized.   Left testicle   Measurements: 5.3 x 3.1 x 2.1 cm. No mass or microlithiasis visualized.   Right epididymis:  Normal in size.  2 mm epididymal cyst.   Left epididymis:  Normal in size and appearance.   Hydrocele:  Small bilateral hydroceles.   Varicocele:  None visualized.   Pulsed Doppler interrogation of both testes demonstrates normal low resistance arterial and venous waveforms bilaterally.   IMPRESSION: 1. Normal size and ultrasound appearance of the bilateral testicles. No evidence of testicular torsion. 2. Small bilateral hydroceles.     Electronically Signed   By: Maudry Mayhew M.D.   On: 09/18/2021 15:39  Scrotal ultrasound was personally reviewed.  Agree with radiologic interpretation, essentially normal radiologic study.  Assessment & Plan:    Scrotal pain  -Physical exam and ultrasound findings today are essentially benign, no evidence of clinical hydrocele or varicocele although his history is more consistent with the latter - Counseled him on management through scrotal support; includes underwear that has compression or shorts. To keep scrotum close to the body and immobilize.  - Prescribed antiinflammatory meloxicam 7.5. Try to take this for two weeks. If experiences  - If your symptoms fail to resolve after two weeks call and reschedule for reevaluation. If your symptoms are signifcantly worse call sooner and we will repeat a scrotal ultrasound.   2. Chronic Constipation  - Could be causing scrotal pain. Recommend laxative to promote bowel movement.  - Take Miralax for constipation.  - Recommend daily probiotic to help with constipation and bloating   Follow-up as needed  I,Kailey Littlejohn,acting as a scribe for Vanna Scotland, MD.,have documented all relevant documentation on the behalf of Vanna Scotland,  MD,as directed by  Vanna Scotland, MD while in the presence of Vanna Scotland, MD.   [facility] [address], [address] [place], [place] [address] [phone]

## 2021-10-01 ENCOUNTER — Other Ambulatory Visit: Payer: Self-pay

## 2021-10-01 ENCOUNTER — Encounter: Payer: Self-pay | Admitting: Urology

## 2021-10-01 ENCOUNTER — Ambulatory Visit (INDEPENDENT_AMBULATORY_CARE_PROVIDER_SITE_OTHER): Payer: Self-pay | Admitting: Urology

## 2021-10-01 VITALS — BP 126/77 | HR 66 | Ht 74.0 in | Wt 199.0 lb

## 2021-10-01 DIAGNOSIS — N433 Hydrocele, unspecified: Secondary | ICD-10-CM

## 2021-10-01 LAB — URINALYSIS, COMPLETE
Bilirubin, UA: NEGATIVE
Glucose, UA: NEGATIVE
Ketones, UA: NEGATIVE
Leukocytes,UA: NEGATIVE
Nitrite, UA: NEGATIVE
Protein,UA: NEGATIVE
Specific Gravity, UA: 1.015 (ref 1.005–1.030)
Urobilinogen, Ur: 0.2 mg/dL (ref 0.2–1.0)
pH, UA: 5.5 (ref 5.0–7.5)

## 2021-10-01 MED ORDER — MELOXICAM 7.5 MG PO TABS: 7.5000 mg | ORAL_TABLET | Freq: Every day | ORAL | 0 refills | Status: DC

## 2021-10-01 MED ORDER — MELOXICAM 7.5 MG PO TABS: 7.5000 mg | ORAL_TABLET | Freq: Every day | ORAL | 0 refills | Status: AC

## 2021-10-01 NOTE — Patient Instructions (Signed)
1.  I recommend wearing scrotal support to keep your scrotum close to your body and immobilized.  Compression shorts work really well for this.  2.  You been prescribed meloxicam which is a strong anti-inflammatory.  Try to take this for 2 weeks.  If you have any GI upset from this, stop taking it.  3.  We talked about working on your diet and bowel habits.  You can use MiraLAX which is an over the counter laxative to help you have a daily soft bowel movement per day.  Daily probiotics, any brand, may help some with your gas and bloating.  4.  If your symptoms fail to resolve after 2 weeks, call and schedule follow-up with either myself or one of my PAs for reevaluation.  If your symptoms are significantly worse, call sooner and we will repeat the scrotal ultrasound.

## 2022-03-14 ENCOUNTER — Emergency Department: Payer: Self-pay

## 2022-03-14 ENCOUNTER — Other Ambulatory Visit: Payer: Self-pay

## 2022-03-14 ENCOUNTER — Emergency Department
Admission: EM | Admit: 2022-03-14 | Discharge: 2022-03-14 | Disposition: A | Discharge status: Home / Self Care | Payer: Self-pay | Attending: Emergency Medicine | Admitting: Emergency Medicine

## 2022-03-14 ENCOUNTER — Encounter: Payer: Self-pay | Admitting: Emergency Medicine

## 2022-03-14 DIAGNOSIS — J45909 Unspecified asthma, uncomplicated: Secondary | ICD-10-CM | POA: Insufficient documentation

## 2022-03-14 DIAGNOSIS — J029 Acute pharyngitis, unspecified: Secondary | ICD-10-CM | POA: Insufficient documentation

## 2022-03-14 DIAGNOSIS — Q892 Congenital malformations of other endocrine glands: Secondary | ICD-10-CM | POA: Insufficient documentation

## 2022-03-14 LAB — BASIC METABOLIC PANEL
Anion gap: 9 (ref 5–15)
BUN: 8 mg/dL (ref 6–20)
CO2: 28 mmol/L (ref 22–32)
Calcium: 9.1 mg/dL (ref 8.9–10.3)
Chloride: 96 mmol/L — ABNORMAL LOW (ref 98–111)
Creatinine, Ser: 0.78 mg/dL (ref 0.61–1.24)
GFR, Estimated: >60 mL/min (ref >60)
Glucose, Bld: 102 mg/dL — ABNORMAL HIGH (ref 70–99)
Potassium: 3.3 mmol/L — ABNORMAL LOW (ref 3.5–5.1)
Sodium: 133 mmol/L — ABNORMAL LOW (ref 135–145)

## 2022-03-14 LAB — CBC WITH DIFFERENTIAL/PLATELET
Abs Immature Granulocytes: 0.02 10*3/uL (ref 0.00–0.07)
Basophils Absolute: 0 10*3/uL (ref 0.0–0.1)
Basophils Relative: 0 %
Eosinophils Absolute: 0.1 10*3/uL (ref 0.0–0.5)
Eosinophils Relative: 1 %
HCT: 43 % (ref 39.0–52.0)
Hemoglobin: 15.1 g/dL (ref 13.0–17.0)
Immature Granulocytes: 0 %
Lymphocytes Relative: 20 %
Lymphs Abs: 1.8 10*3/uL (ref 0.7–4.0)
MCH: 33.6 pg (ref 26.0–34.0)
MCHC: 35.1 g/dL (ref 30.0–36.0)
MCV: 95.6 fL (ref 80.0–100.0)
Monocytes Absolute: 0.8 10*3/uL (ref 0.1–1.0)
Monocytes Relative: 8 %
Neutro Abs: 6.4 10*3/uL (ref 1.7–7.7)
Neutrophils Relative %: 71 %
Platelets: 196 10*3/uL (ref 150–400)
RBC: 4.5 MIL/uL (ref 4.22–5.81)
RDW: 11.2 % — ABNORMAL LOW (ref 11.5–15.5)
WBC: 9.1 10*3/uL (ref 4.0–10.5)
nRBC: 0 % (ref 0.0–0.2)

## 2022-03-14 LAB — GROUP A STREP BY PCR: Group A Strep by PCR: NOT DETECTED

## 2022-03-14 LAB — MONONUCLEOSIS SCREEN: Mono Screen: NEGATIVE

## 2022-03-14 MED ORDER — AMOXICILLIN 500 MG PO TABS: 500.0000 mg | ORAL_TABLET | Freq: Three times a day (TID) | ORAL | 0 refills | Status: DC

## 2022-03-14 MED ORDER — METHYLPREDNISOLONE SODIUM SUCC 125 MG IJ SOLR
125.0000 mg | Freq: Once | INTRAMUSCULAR | Status: AC
  Administered 2022-03-14: 125 mg via INTRAMUSCULAR
  Filled 2022-03-14: qty 2

## 2022-03-14 MED ORDER — PREDNISONE 5 MG PO TBEC: DELAYED_RELEASE_TABLET | ORAL | 0 refills | Status: AC

## 2022-03-14 MED ORDER — IOHEXOL 300 MG/ML  SOLN
75.0000 mL | Freq: Once | INTRAMUSCULAR | Status: AC | PRN
  Administered 2022-03-14: 75 mL via INTRAVENOUS
  Filled 2022-03-14: qty 75

## 2022-03-14 NOTE — ED Notes (Signed)
Back from CT

## 2022-03-14 NOTE — ED Triage Notes (Signed)
Sore throat x 1 day.  Coughing up phlegm, blood tinged. ?

## 2022-03-14 NOTE — ED Notes (Signed)
Patient transported to CT 

## 2022-03-14 NOTE — ED Provider Notes (Signed)
[facility] ?Emergency Department Provider Note ? ?____________________________________________ ? ?Time seen: Approximately 10:22 AM ? ?I have reviewed the triage vital signs and the nursing notes. ? ? ?HISTORY ? ?Chief Complaint ?Sore Throat ? ? ? ?HPI ?Leon Farmer is a 21 y.o. male with history of asthma and remaining history as listed in the EMR presents to the emergency department for treatment and evaluation of 2 days sore throat that seems to have gotten worse overnight.  Patient states that he feels that he cannot breathe when he lays flat.  He feels as though something is stuck in his throat but denies eating anything that may have caused the symptoms. ?____________________________________________ ? ? ?PHYSICAL EXAM: ? ?VITAL SIGNS: ?ED Triage Vitals  ?Enc Vitals Group  ?   BP 03/14/22 0709 125/72  ?   Pulse Rate 03/14/22 0709 70  ?   Resp 03/14/22 0709 15  ?   Temp 03/14/22 0709 97.6 ?F (36.4 ?C)  ?   Temp Source 03/14/22 0709 Oral  ?   SpO2 03/14/22 0709 98 %  ?   Weight 03/14/22 0704 199 lb 1.2 oz (90.3 kg)  ?   Height 03/14/22 0704 6\' 2"  (1.88 m)  ?   Head Circumference --   ?   Peak Flow --   ?   Pain Score 03/14/22 0704 9  ?   Pain Loc --   ?   Pain Edu? --   ?   Excl. in [place]? --   ? ? ?Constitutional: Alert and oriented.  ?Mouth/Throat: Mucous membranes are moist.  Oropharynx,without erythema, tonsils normal without exudate. Uvula is midline. ?Neck: No stridor. Voice Clear when sitting up, changes with laying down. ?Lymphatic: Anterior cervical nodes tender. ?Cardiovascular: Normal rate, regular rhythm. Good peripheral circulation. ?Respiratory: Normal respiratory effort. Lungs CTAB. ?Gastrointestinal: Soft and nontender. ?Musculoskeletal: FROM of neck, upper and lower extremities. ?Neurologic:  Normal speech and language. No gross focal neurologic deficits are appreciated. ?Skin:  Skin is warm, dry and intact. No rash noted ?Psychiatric: Mood and affect are normal. Speech  and behavior are normal. ? ?____________________________________________ ?  ?LABS ?(all labs ordered are listed, but only abnormal results are displayed) ? ?Labs Reviewed  ?CBC WITH DIFFERENTIAL/PLATELET - Abnormal; Notable for the following components:  ?    Result Value  ? RDW 11.2 (*)   ? All other components within normal limits  ?BASIC METABOLIC PANEL - Abnormal; Notable for the following components:  ? Sodium 133 (*)   ? Potassium 3.3 (*)   ? Chloride 96 (*)   ? Glucose, Bld 102 (*)   ? All other components within normal limits  ?GROUP A STREP BY PCR  ?MONONUCLEOSIS SCREEN  ? ?____________________________________________ ? ?EKG ? ?Not indiated ?____________________________________________ ? ?RADIOLOGY ? ?CT soft tissue neck without evidence of tonsillitis or tonsillar abscess.  Airway is patent throughout.  There is likely a small thyroglossal duct cyst. ?____________________________________________ ? ? ?PROCEDURES ? ?Procedure(s) performed: None ? ?Critical Care performed: No ?____________________________________________ ? ? ?INITIAL IMPRESSION / ASSESSMENT AND PLAN / ED COURSE ? ?21 year old male presenting to the emergency department for treatment and evaluation of dysphagia and foreign body sensation.  Symptoms have been present for 2 days.  Strep screen is negative. ? ?When the patient lays flat his voice does change and is more muffled than when he is sitting up.  Plan will be to give him some IV Solu-Medrol and get a CT scan to look for any type of obstruction,  tonsillitis, epiglottitis, mass or lesion. ? ?CT scan does show that he has a lesion in the preepiglottic fat that may likely be a thyroglossal duct cyst.  CT cannot rule out infection but there is no mass effect or surrounding inflammatory changes.  Consulted with Dr. Elenore Rota who recommends treatment with antibiotics and prednisone taper with plan for follow up outpatient late next week.  ? ?CT results and follow up plan discussed with patient.  Medications sent to his pharmacy.  He will return to the ER for symptoms of concern if unable to get in with primary care or ENT. ? ?Pertinent labs & imaging results that were available during my care of the patient were reviewed by me and considered in my medical decision making (see chart for details). ?____________________________________________ ? ?Discharge Medication List as of 03/14/2022 10:37 AM  ?  ? ?START taking these medications  ? Details  ?amoxicillin (AMOXIL) 500 MG tablet Take 1 tablet (500 mg total) by mouth 3 (three) times daily., Starting Fri 03/14/2022, Normal  ?  ?predniSONE 5 MG TBEC Multiple Dosages:Starting Fri 03/14/2022, Until Fri 03/14/2022 at 2359, THEN Starting Sat 03/15/2022, Until Sat 03/15/2022 at 2359, THEN Starting Sun 03/16/2022, Until Sun 03/16/2022 at 2359, THEN Starting Mon 03/17/2022, Until Mon 03/17/2022 at 2359, THEN Starti ng Tue 03/18/2022, Until Tue 03/18/2022 at 2359, THEN Starting Wed 03/19/2022, Until Wed 03/19/2022 at 2359Take 6 tablets by mouth daily for 1 day, THEN 5 tablets daily for 1 day, THEN 4 tablets daily for 1 day, THEN 3 tablets daily for 1 day, THEN 2 table ts daily for 1 day, THEN 1 tablet daily for 1 day., Normal  ?  ?  ? ? ?FINAL CLINICAL IMPRESSION(S) / ED DIAGNOSES ? ?Final diagnoses:  ?Thyroglossal duct cyst  ? ? ?If controlled substance prescribed during this visit, 12 month history viewed on the [facility] prior to issuing an initial prescription for Schedule II or III opiod. ? ? ?Note:  This document was prepared using Dragon voice recognition software and may include unintentional dictation errors. ? ?  ?Chinita Pester, FNP ?03/14/22 1550 ? ?  ?Gilles Chiquito, MD ?03/14/22 1720 ? ?

## 2022-03-14 NOTE — ED Notes (Signed)
See triage note  presents with sore throat which became worse early this am   increased pain with swallowing  afebrile on arrival  ? ?

## 2022-03-14 NOTE — Discharge Instructions (Signed)
Please take the medication as prescribed. ?Call today to request an appointment for next week with the ENT specialist. ?Return to the ER for symptoms that change or worsen. ?

## 2022-09-29 ENCOUNTER — Emergency Department: Payer: Self-pay

## 2022-09-29 ENCOUNTER — Emergency Department
Admission: EM | Admit: 2022-09-29 | Discharge: 2022-09-29 | Disposition: A | Discharge status: Home / Self Care | Payer: Self-pay | Attending: Emergency Medicine | Admitting: Emergency Medicine

## 2022-09-29 DIAGNOSIS — I861 Scrotal varices: Secondary | ICD-10-CM | POA: Insufficient documentation

## 2022-09-29 DIAGNOSIS — N503 Cyst of epididymis: Secondary | ICD-10-CM | POA: Insufficient documentation

## 2022-09-29 DIAGNOSIS — N50811 Right testicular pain: Secondary | ICD-10-CM

## 2022-09-29 LAB — CHLAMYDIA/NGC RT PCR (ARMC ONLY)
Chlamydia Tr: NOT DETECTED
N gonorrhoeae: NOT DETECTED

## 2022-09-29 LAB — BASIC METABOLIC PANEL
Anion gap: 5 (ref 5–15)
BUN: 15 mg/dL (ref 6–20)
CO2: 30 mmol/L (ref 22–32)
Calcium: 9.3 mg/dL (ref 8.9–10.3)
Chloride: 102 mmol/L (ref 98–111)
Creatinine, Ser: 0.79 mg/dL (ref 0.61–1.24)
GFR, Estimated: >60 mL/min (ref >60)
Glucose, Bld: 100 mg/dL — ABNORMAL HIGH (ref 70–99)
Potassium: 3.9 mmol/L (ref 3.5–5.1)
Sodium: 137 mmol/L (ref 135–145)

## 2022-09-29 LAB — CBC
HCT: 42.9 % (ref 39.0–52.0)
Hemoglobin: 15 g/dL (ref 13.0–17.0)
MCH: 34.5 pg — ABNORMAL HIGH (ref 26.0–34.0)
MCHC: 35 g/dL (ref 30.0–36.0)
MCV: 98.6 fL (ref 80.0–100.0)
Platelets: 249 10*3/uL (ref 150–400)
RBC: 4.35 MIL/uL (ref 4.22–5.81)
RDW: 11.5 % (ref 11.5–15.5)
WBC: 5.3 10*3/uL (ref 4.0–10.5)
nRBC: 0.4 % — ABNORMAL HIGH (ref 0.0–0.2)

## 2022-09-29 MED ORDER — MELOXICAM 7.5 MG PO TABS: 7.5000 mg | ORAL_TABLET | Freq: Every day | ORAL | 2 refills | Status: DC

## 2022-09-29 NOTE — ED Triage Notes (Signed)
Pt sts that he has been having pain the groin area. Pt sts he was at his new job and sts that he felt a pop in his left hip and pain shot down to his groin. Pt is ambulatory.

## 2022-09-29 NOTE — ED Provider Notes (Signed)
[facility] Provider Note   Event Date/Time   First MD Initiated Contact with Patient 09/29/22 1645     (approximate) History  Groin Pain  HPI Leon Farmer is a 21 y.o. male with a known right hydrocele who presents for persistent groin pain.  Patient states that it has been intermittent over the last few months and he has seen a urologist to evaluate this in the past that only showed a small hydrocele at that time.  Patient states that he works for [employer] and is concerned that he may have a hernia that he is continuing to worsen given that he lifts things all day.  Patient also relates that he had a popping sensation to the left flank yesterday that worsened the symptoms.  Patient denies any difficulty with urination, bowel/bladder incontinence, urine color change.  Patient currently denies the pain is 3/10 and aching ROS: Patient currently denies any vision changes, tinnitus, difficulty speaking, facial droop, sore throat, chest pain, shortness of breath, abdominal pain, nausea/vomiting/diarrhea, dysuria, or weakness/numbness/paresthesias in any extremity   Physical Exam  Triage Vital Signs: ED Triage Vitals  Enc Vitals Group     BP 09/29/22 1509 125/73     Pulse Rate 09/29/22 1509 64     Resp 09/29/22 1509 18     Temp 09/29/22 1509 98.1 F (36.7 C)     Temp Source 09/29/22 1509 Oral     SpO2 09/29/22 1509 100 %     Weight 09/29/22 1510 190 lb (86.2 kg)     Height --      Head Circumference --      Peak Flow --      Pain Score 09/29/22 1510 3     Pain Loc --      Pain Edu? --      Excl. in [place]? --    Most recent vital signs: Vitals:   09/29/22 1509  BP: 125/73  Pulse: 64  Resp: 18  Temp: 98.1 F (36.7 C)  SpO2: 100%   General: Awake, oriented x4. CV:  Good peripheral perfusion.  Resp:  Normal effort.  Abd:  No distention.  Other:  Young adult Caucasian male sitting in bed in no acute distress.  Testicle in normal lie and cremasteric reflex  intact. ED Results / Procedures / Treatments  Labs (all labs ordered are listed, but only abnormal results are displayed) Labs Reviewed  CBC - Abnormal; Notable for the following components:      Result Value   MCH 34.5 (*)    nRBC 0.4 (*)    All other components within normal limits  BASIC METABOLIC PANEL - Abnormal; Notable for the following components:   Glucose, Bld 100 (*)    All other components within normal limits  CHLAMYDIA/NGC RT PCR [facility] ONLY)             RADIOLOGY ED MD interpretation:  Ultrasound of the scrotum with Doppler imaging interpreted by me independently and shows no evidence of testicular mass or torsion but does show a small right epididymal cyst as well as possible right varicocele -Agree with radiology assessment Official radiology report(s): US SCROTUM W/DOPPLER  Result Date: 09/29/2022 CLINICAL DATA:  Left-sided scrotal pain for several months. EXAM: SCROTAL ULTRASOUND DOPPLER ULTRASOUND OF THE TESTICLES TECHNIQUE: Complete ultrasound examination of the testicles, epididymis, and other scrotal structures was performed. Color and spectral Doppler ultrasound were also utilized to evaluate blood flow to the testicles. COMPARISON:  September 18, 2021.  FINDINGS: Right testicle Measurements: 4.9 x 2.5 x 3.1 cm. No mass or microlithiasis visualized. Left testicle Measurements: 5.3 x 2.2 x 3.1 cm. No mass or microlithiasis visualized. Right epididymis:  2 mm cyst is noted. Left epididymis:  Normal in size and appearance. Hydrocele:  None visualized. Varicocele:  Possible small right varicocele. Pulsed Doppler interrogation of both testes demonstrates normal low resistance arterial and venous waveforms bilaterally. IMPRESSION: No evidence of testicular mass or torsion. Small right epididymal cyst. Possible small right varicocele. Electronically Signed   By: Lupita Raider M.D.   On: 09/29/2022 16:22   PROCEDURES: Critical Care performed: No Procedures MEDICATIONS ORDERED  IN ED: Medications - No data to display IMPRESSION / MDM / ASSESSMENT AND PLAN / ED COURSE  I reviewed the triage vital signs and the nursing notes.                             Patient's presentation is most consistent with acute presentation with potential threat to life or bodily function. The patient is suffering from testicular pain, but based on the history, exam, and testing, I do not suspect that the patient has testicular torsion, abscess, severe cellulitis, [place] gangrene, or other emergent cause.  UA unremarkable US Scrotum: Epididymal cyst and possible small varicocele on the right  Disposition: Plan follow up with primary care doctor for symptom re-check and possible referral to urology. Discussed return precautions at bedside. Discharge.   FINAL CLINICAL IMPRESSION(S) / ED DIAGNOSES   Final diagnoses:  Right testicular pain  Right varicocele  Epididymal cyst   Rx / DC Orders   ED Discharge Orders          Ordered    meloxicam (MOBIC) 7.5 MG tablet  Daily        09/29/22 1658           Note:  This document was prepared using Dragon voice recognition software and may include unintentional dictation errors.   Merwyn Katos, MD 09/29/22 [phone]

## 2022-10-22 ENCOUNTER — Telehealth: Payer: Self-pay | Admitting: *Deleted

## 2022-10-22 NOTE — Telephone Encounter (Signed)
Pt was on Dr. Richardo Hanks schedule for 10/23/22 for ER follow-up and scheduled as new patient. Pt has already seen Dr. Apolinar Junes for the same thing, switched pt to Dr. Apolinar Junes schedule.  .left message to have patient return my call, need to inform him of schedule change  Spoke with mom and advised of time change and provider change

## 2022-10-23 ENCOUNTER — Ambulatory Visit: Payer: Self-pay | Admitting: Urology

## 2022-10-23 NOTE — Progress Notes (Incomplete)
10/23/2022 8:39 AM   Campbell Stall Jul 21, 2001 [id number]  Referring provider: Elfredia Nevins, MD [address] [place],  [place] [address]  No chief complaint on file.   HPI:    PMH: Past Medical History:  Diagnosis Date   Asthma    Asthma    Fracture of wrist     Surgical History: Past Surgical History:  Procedure Laterality Date   NOSE SURGERY      Home Medications:  Allergies as of 10/23/2022       Reactions   Shrimp [shellfish Allergy]         Medication List        Accurate as of October 23, 2022  8:39 AM. If you have any questions, ask your nurse or doctor.          amoxicillin 500 MG tablet Commonly known as: AMOXIL Take 1 tablet (500 mg total) by mouth 3 (three) times daily.   meloxicam 7.5 MG tablet Commonly known as: Mobic Take 1 tablet (7.5 mg total) by mouth daily.        Allergies:  Allergies  Allergen Reactions   Shrimp [Shellfish Allergy]     Family History: Family History  Problem Relation Age of Onset   Heart disease Other    Diabetes Other     Social History:  reports that he has been smoking. He has never used smokeless tobacco. He reports current drug use. Drug: Marijuana. He reports that he does not drink alcohol.   Physical Exam: There were no vitals taken for this visit.  Constitutional:  Alert and oriented, No acute distress. HEENT: [place] AT, moist mucus membranes.  Trachea midline, no masses. Cardiovascular: No clubbing, cyanosis, or edema. Respiratory: Normal respiratory effort, no increased work of breathing. GI: Abdomen is soft, nontender, nondistended, no abdominal masses GU: No CVA tenderness Skin: No rashes, bruises or suspicious lesions. Neurologic: Grossly intact, no focal deficits, moving all 4 extremities. Psychiatric: Normal mood and affect.  Laboratory Data: Lab Results  Component Value Date   WBC 5.3 09/29/2022   HGB 15.0 09/29/2022   HCT 42.9 09/29/2022   MCV 98.6 09/29/2022    PLT 249 09/29/2022    Lab Results  Component Value Date   CREATININE 0.79 09/29/2022    No results found for: "PSA"  No results found for: "TESTOSTERONE"  No results found for: "HGBA1C"  Urinalysis    Component Value Date/Time   COLORURINE COLORLESS (A) 09/18/2021 1147   APPEARANCEUR Clear 10/01/2021 0842   LABSPEC 1.002 (L) 09/18/2021 1147   PHURINE 8.0 09/18/2021 1147   GLUCOSEU Negative 10/01/2021 0842   HGBUR NEGATIVE 09/18/2021 1147   BILIRUBINUR Negative 10/01/2021 0842   KETONESUR NEGATIVE 09/18/2021 1147   PROTEINUR Negative 10/01/2021 0842   PROTEINUR NEGATIVE 09/18/2021 1147   NITRITE Negative 10/01/2021 0842   NITRITE NEGATIVE 09/18/2021 1147   LEUKOCYTESUR Negative 10/01/2021 0842   LEUKOCYTESUR NEGATIVE 09/18/2021 1147    Lab Results  Component Value Date   BACTERIA NONE SEEN 09/18/2021    Pertinent Imaging: *** No results found for this or any previous visit.  No results found for this or any previous visit.  No results found for this or any previous visit.  No results found for this or any previous visit.  No results found for this or any previous visit.  No valid procedures specified. No results found for this or any previous visit.  No results found for this or any previous visit.   Assessment &  Plan:    There are no diagnoses linked to this encounter.  No follow-ups on file.  Vanna Scotland, MD  [facility] [address] [place], [place] [address] [phone]

## 2022-10-29 ENCOUNTER — Ambulatory Visit (INDEPENDENT_AMBULATORY_CARE_PROVIDER_SITE_OTHER): Payer: Self-pay | Admitting: Urology

## 2022-10-29 VITALS — BP 125/76 | HR 64 | Ht 74.0 in | Wt 200.0 lb

## 2022-10-29 DIAGNOSIS — N50812 Left testicular pain: Secondary | ICD-10-CM

## 2022-10-29 DIAGNOSIS — N503 Cyst of epididymis: Secondary | ICD-10-CM

## 2022-10-29 DIAGNOSIS — R1032 Left lower quadrant pain: Secondary | ICD-10-CM

## 2022-10-29 DIAGNOSIS — G8929 Other chronic pain: Secondary | ICD-10-CM

## 2022-10-29 DIAGNOSIS — I861 Scrotal varices: Secondary | ICD-10-CM

## 2022-10-29 LAB — URINALYSIS, COMPLETE
Bilirubin, UA: NEGATIVE
Glucose, UA: NEGATIVE
Ketones, UA: NEGATIVE
Leukocytes,UA: NEGATIVE
Nitrite, UA: NEGATIVE
Protein,UA: NEGATIVE
RBC, UA: NEGATIVE
Specific Gravity, UA: 1.01 (ref 1.005–1.030)
Urobilinogen, Ur: 0.2 mg/dL (ref 0.2–1.0)
pH, UA: 7 (ref 5.0–7.5)

## 2022-10-29 LAB — MICROSCOPIC EXAMINATION: Bacteria, UA: NONE SEEN

## 2022-10-29 NOTE — Progress Notes (Signed)
10/29/2022 10:57 AM   Leon Farmer November 17, 2001 [id number]  Referring provider: Elfredia Nevins, MD [address] [place],  [place] [address]  Chief Complaint  Patient presents with   Hospitalization Follow-up    HPI: 21 year old male who presents today for left inguinal pain.  Newly, he was seen and evaluated by me for the same issue about a year ago.  He reports that he was working doing some heavy lifting when it first started happening in retrospect.  The pain is specifically overlying his left groin area exacerbated with movement and activity.  It is a little bit worse at the end of the day.  He does note that he has a small left varicocele but denies any dull aching.  He has been using NSAIDs as needed as well as scrotal support since her last visit.  In the emergency room and had a issue.  He had repeat scrotal ultrasound which showed some low-grade left varicocele and incidental right epididymal cyst, small testicles are normal bilaterally.  PMH: Past Medical History:  Diagnosis Date   Asthma    Asthma    Fracture of wrist     Surgical History: Past Surgical History:  Procedure Laterality Date   NOSE SURGERY      Home Medications:  Allergies as of 10/29/2022       Reactions   Shrimp [shellfish Allergy]         Medication List        Accurate as of October 29, 2022 11:59 PM. If you have any questions, ask your nurse or doctor.          STOP taking these medications    amoxicillin 500 MG tablet Commonly known as: AMOXIL   meloxicam 7.5 MG tablet Commonly known as: Mobic        Allergies:  Allergies  Allergen Reactions   Shrimp [Shellfish Allergy]     Family History: Family History  Problem Relation Age of Onset   Heart disease Other    Diabetes Other     Social History:  reports that he has been smoking. He has never used smokeless tobacco. He reports current drug use. Drug: Marijuana. He reports that he does not drink  alcohol.   Physical Exam: BP 125/76   Pulse 64   Ht 6\' 2"  (1.88 m)   Wt 200 lb (90.7 kg)   BMI 25.68 kg/m   Constitutional:  Alert and oriented, No acute distress. HEENT: [place] AT, moist mucus membranes.  Trachea midline, no masses. Cardiovascular: No clubbing, cyanosis, or edema. Respiratory: Normal respiratory effort, no increased work of breathing. GI: Abdomen is soft, nontender, nondistended, no abdominal masses GU: Normal bilateral descended testicles without masses.  Testicles are nontender.  There is a very subtle left varicocele, grade 1.  Small right epididymal cyst is nonpalpable.  No evidence of cord pathology.  Focal tenderness immediately overlying the left inguinal ring, examined standing with Valsalva and no palpable hernia although this is very clearly the location of discomfort. Skin: No rashes, bruises or suspicious lesions. Neurologic: Grossly intact, no focal deficits, moving all 4 extremities. Psychiatric: Normal mood and affect.  Laboratory Data: Lab Results  Component Value Date   WBC 5.3 09/29/2022   HGB 15.0 09/29/2022   HCT 42.9 09/29/2022   MCV 98.6 09/29/2022   PLT 249 09/29/2022    Lab Results  Component Value Date   CREATININE 0.79 09/29/2022     Urinalysis    Component Value Date/Time  COLORURINE COLORLESS (A) 09/18/2021 1147   APPEARANCEUR Clear 10/29/2022 1542   LABSPEC 1.002 (L) 09/18/2021 1147   PHURINE 8.0 09/18/2021 1147   GLUCOSEU Negative 10/29/2022 1542   HGBUR NEGATIVE 09/18/2021 1147   BILIRUBINUR Negative 10/29/2022 1542   KETONESUR NEGATIVE 09/18/2021 1147   PROTEINUR Negative 10/29/2022 1542   PROTEINUR NEGATIVE 09/18/2021 1147   NITRITE Negative 10/29/2022 1542   NITRITE NEGATIVE 09/18/2021 1147   LEUKOCYTESUR Negative 10/29/2022 1542   LEUKOCYTESUR NEGATIVE 09/18/2021 1147    Lab Results  Component Value Date   LABMICR See below: 10/29/2022   WBCUA 0-5 10/29/2022   LABEPIT 0-10 10/29/2022   BACTERIA None seen  10/29/2022    Pertinent Imaging: Narrative & Impression  CLINICAL DATA:  Left-sided scrotal pain for several months.   EXAM: SCROTAL ULTRASOUND   DOPPLER ULTRASOUND OF THE TESTICLES   TECHNIQUE: Complete ultrasound examination of the testicles, epididymis, and other scrotal structures was performed. Color and spectral Doppler ultrasound were also utilized to evaluate blood flow to the testicles.   COMPARISON:  September 18, 2021.   FINDINGS: Right testicle   Measurements: 4.9 x 2.5 x 3.1 cm. No mass or microlithiasis visualized.   Left testicle   Measurements: 5.3 x 2.2 x 3.1 cm. No mass or microlithiasis visualized.   Right epididymis:  2 mm cyst is noted.   Left epididymis:  Normal in size and appearance.   Hydrocele:  None visualized.   Varicocele:  Possible small right varicocele.   Pulsed Doppler interrogation of both testes demonstrates normal low resistance arterial and venous waveforms bilaterally.   IMPRESSION: No evidence of testicular mass or torsion.   Small right epididymal cyst.   Possible small right varicocele.     Electronically Signed   By: Lupita Raider M.D.   On: 09/29/2022 16:22   Scrotal ultrasound was reviewed again personally by me today, agree with radiology interpretation.  Assessment & Plan:    1. Chronic pain of left inguinal region Pain is very clearly over the left inguinal ring without palpable hernia  He has no cross-sectional imaging, he was able to completely rule out hernia this will for additional surgery for further evaluation.  He may have was maybe alluded to as "sports hernia" with chronic pain over the ring.  He may benefit from block or other intervention.  Will defer to Dr. Everlene Farrier for further evaluation of this.  If this intervention/evaluation fails, will consider referral to testicular pain specialist in [place], Dr. Roxy Horseman although feeling fatigue is more inguinal and scrotal.  - Urinalysis, Complete -  Ambulatory referral to General Surgery  2. Varicocele Extremely subtle on both ultrasound and on exam, do not feel that this is contributing factor based on the aforementioned as well as nature of pain and location  3. Epididymal cyst Incidental, asymptomatic, not contributing    Vanna Scotland, MD  [facility] [address] [place], [place] [address] [phone]

## 2022-11-03 ENCOUNTER — Ambulatory Visit (INDEPENDENT_AMBULATORY_CARE_PROVIDER_SITE_OTHER): Payer: Self-pay | Admitting: Surgery

## 2022-11-03 ENCOUNTER — Other Ambulatory Visit: Payer: Self-pay

## 2022-11-03 ENCOUNTER — Encounter: Payer: Self-pay | Admitting: Surgery

## 2022-11-03 VITALS — BP 127/80 | HR 70 | Temp 98.7°F | Ht 75.0 in | Wt 212.0 lb

## 2022-11-03 DIAGNOSIS — K4090 Unilateral inguinal hernia, without obstruction or gangrene, not specified as recurrent: Secondary | ICD-10-CM

## 2022-11-03 NOTE — Patient Instructions (Addendum)
CT Abdomen and Pelvis scheduled @ Outpatient Imaging on 11/05/22 @ 2:15 pm. Nothing to eat/drink 4 hours prior to having scan. Please see your follow up appointment listed below.

## 2022-11-04 NOTE — Progress Notes (Unsigned)
Patient ID: Leon Farmer, male   DOB: 2001/01/13, 21 y.o.   MRN: [id number]  HPI Leon Farmer is a 21 y.o. male seen in consultation at the request of Dr. 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  He reports that he has intermittent left inguinal pain for the last year or so.  Pain is sharp and moderate intensity.  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 Initially saw urology a year ago.  Urology has find a clear-cut evidence for his inguinal and scrotal pain.  He did have an ultrasound that I have personally reviewed showing normal testicles and small bilateral hydroceles.  He is very active and is able to perform more than 6 METS of activity without any shortness of breath or chest pain.  He does vapes on a daily basis and uses some marijuana on a regular basis as well. He denies any inguinal operations or abdominal operations in the past. Did have a CBC BMP as well as chlamydia in the last ER visit showing no abnormalities.  HPI  Past Medical History:  Diagnosis Date   Asthma    Asthma    Fracture of wrist     Past Surgical History:  Procedure Laterality Date   NOSE SURGERY      Family History  Problem Relation Age of Onset   Heart disease Other    Diabetes Other     Social History Social History   Tobacco Use   Smoking status: Some Days   Smokeless tobacco: Never  Vaping Use   Vaping Use: Every day  Substance Use Topics   Alcohol use: No   Drug use: Yes    Types: Marijuana    Allergies  Allergen Reactions   Shrimp [Shellfish Allergy]     No current outpatient medications on file.   No current facility-administered medications for this visit.     Review of Systems Full ROS  was asked and was negative except for the information on the HPI  Physical Exam Blood pressure 127/80, pulse 70, temperature 98.7 F (37.1 C), temperature source Oral, height 6\' 3"  (1.905 m), weight 212 lb (96.2 kg), SpO2 98  %. CONSTITUTIONAL: NAD. EYES: Pupils are equal, round, Sclera are non-icteric. EARS, NOSE, MOUTH AND THROAT: The oropharynx is clear. The oral mucosa is pink and moist. Hearing is intact to voice. LYMPH NODES:  Lymph nodes in the neck are normal. RESPIRATORY:  Lungs are clear. There is normal respiratory effort, with equal breath sounds bilaterally, and without pathologic use of accessory muscles. CARDIOVASCULAR: Heart is regular without murmurs, gallops, or rubs. GI: The abdomen is  soft, nontender, and nondistended. There are no palpable masses. There is no hepatosplenomegaly. There are normal bowel sounds. The inguinal portion I examined the patient both supine and upright skin for good Valsalva maneuvers.  On the left side there is squeeze it tenderness to palpation along the inguinal ring inguinal canal.  Exam is limited due to tenderness on the left side.  On the right side I do feel a small hernia.  It is asymptomatic and is not tender.  GU: No evidence of testicular or penile lesions or masses. MUSCULOSKELETAL: Normal muscle strength and tone. No cyanosis or edema.   SKIN: Turgor is good and there are no pathologic skin lesions or ulcers. NEUROLOGIC: Motor and sensation is grossly normal. Cranial nerves are grossly intact. PSYCH:  Oriented to person, place and time. Affect is normal.  Data Reviewed  I have personally reviewed the patient's imaging, laboratory findings and medical records.    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  Granted the physical exam is limited due to tenderness to palpation.  We will start appropriate workup with a CT scan of the abdomen and pelvis with p.o. contrast.  Discussed with him about measures to improve the pain to include NSAIDs, Tylenol and ice packs.  I do feel a defect on the right side on exam.  I will like to wait for final CT scan to further evaluate his bilateral  inguinal anatomy.  At this time no need for surgical intervention.  He understands that he may have a sports hernia and I typically treat this medically with some formal physical therapy for a few weeks and ilioinguinal nerve block.  Do offer them surgical intervention if they failed medical treatment. Copy of this report was sent to the referring provider Please note that I have spent 55 minutes in this encounter including personally reviewing imaging studies, coordinating her care, counseling the patient and performing appropriate documentation  [name], MD FACS General Surgeon 11/04/2022, 4:08 PM

## 2022-11-05 ENCOUNTER — Encounter: Payer: Self-pay | Admitting: Surgery

## 2022-11-05 ENCOUNTER — Ambulatory Visit
Admission: RE | Admit: 2022-11-05 | Discharge: 2022-11-05 | Disposition: A | Discharge status: Home / Self Care | Payer: Self-pay | Source: Ambulatory Visit | Attending: Surgery | Admitting: Surgery

## 2022-11-05 DIAGNOSIS — K4090 Unilateral inguinal hernia, without obstruction or gangrene, not specified as recurrent: Secondary | ICD-10-CM | POA: Insufficient documentation

## 2022-11-05 MED ORDER — IOHEXOL 300 MG/ML  SOLN
100.0000 mL | Freq: Once | INTRAMUSCULAR | Status: AC | PRN
  Administered 2022-11-05: 100 mL via INTRAVENOUS

## 2022-11-06 ENCOUNTER — Telehealth: Payer: Self-pay

## 2022-11-06 NOTE — Telephone Encounter (Signed)
-----   Message from Leafy Ro, MD sent at 11/05/2022  4:57 PM EST ----- Please let him know CT did not show a definitive hernia, keep f/u w me  ----- Message ----- From: Interface, Rad Results In Sent: 11/05/2022   4:43 PM EST To: Leafy Ro, MD

## 2022-11-06 NOTE — Telephone Encounter (Signed)
Message left for the patient letting him know that the CT scan did not show a definite hernia. He is to follow up as scheduled and may call with any questions.

## 2022-11-10 ENCOUNTER — Ambulatory Visit (INDEPENDENT_AMBULATORY_CARE_PROVIDER_SITE_OTHER): Payer: Self-pay | Admitting: Surgery

## 2022-11-10 ENCOUNTER — Encounter: Payer: Self-pay | Admitting: Surgery

## 2022-11-10 VITALS — BP 125/75 | HR 64 | Temp 98.4°F | Wt 216.2 lb

## 2022-11-10 DIAGNOSIS — R1030 Lower abdominal pain, unspecified: Secondary | ICD-10-CM

## 2022-11-10 DIAGNOSIS — K4090 Unilateral inguinal hernia, without obstruction or gangrene, not specified as recurrent: Secondary | ICD-10-CM

## 2022-11-10 NOTE — Patient Instructions (Addendum)
Your MRI is scheduled for 11/17/2022 at 4 pm (arrive by 3:30 pm) @ [facility].   If you have any concerns or questions, please feel free to call our office.    Inguinal Hernia, Adult An inguinal hernia is when fat or your intestines push through a weak spot in a muscle where your leg meets your lower belly (groin). This causes a bulge. This kind of hernia could also be: In your scrotum, if you are male. In folds of skin around your vagina, if you are male. There are three types of inguinal hernias: Hernias that can be pushed back into the belly (are reducible). This type rarely causes pain. Hernias that cannot be pushed back into the belly (are incarcerated). Hernias that cannot be pushed back into the belly and lose their blood supply (are strangulated). This type needs emergency surgery. What are the causes? This condition is caused by having a weak spot in the muscles or tissues in your groin. This develops over time. The hernia may poke through the weak spot when you strain your lower belly muscles all of a sudden, such as when you: Lift a heavy object. Strain to poop (have a bowel movement). Trouble pooping (constipation) can lead to straining. Cough. What increases the risk? This condition is more likely to develop in: Males. Pregnant females. People who: Are overweight. Work in jobs that require long periods of standing or heavy lifting. Have had an inguinal hernia before. Smoke or have lung disease. These factors can lead to long-term (chronic) coughing. What are the signs or symptoms? Symptoms may depend on the size of the hernia. Often, a small hernia has no symptoms. Symptoms of a larger hernia may include: A bulge in the groin area. This is easier to see when standing. You might not be able to see it when you are lying down. Pain or burning in the groin. This may get worse when you lift, strain, or cough. A dull ache or a feeling of pressure in the groin. An  abnormal bulge in the scrotum, in males. Symptoms of a strangulated inguinal hernia may include: A bulge in your groin that is very painful and tender to the touch. A bulge that turns red or purple. Fever, feeling like you may vomit (nausea), and vomiting. Not being able to poop or to pass gas. How is this treated? Treatment depends on the size of your hernia and whether you have symptoms. If you do not have symptoms, your doctor may have you watch your hernia carefully and have you come in for follow-up visits. If your hernia is large or if you have symptoms, you may need surgery to repair the hernia. Follow these instructions at home: Lifestyle Avoid lifting heavy objects. Avoid standing for long amounts of time. Do not smoke or use any products that contain nicotine or tobacco. If you need help quitting, ask your doctor. Stay at a healthy weight. Prevent trouble pooping You may need to take these actions to prevent or treat trouble pooping: Drink enough fluid to keep your pee (urine) pale yellow. Take over-the-counter or prescription medicines. Eat foods that are high in fiber. These include beans, whole grains, and fresh fruits and vegetables. Limit foods that are high in fat and sugar. These include fried or sweet foods. General instructions You may try to push your hernia back in place by very gently pressing on it when you are lying down. Do not try to push the bulge back in if it  will not go in easily. Watch your hernia for any changes in shape, size, or color. Tell your doctor if you see any changes. Take over-the-counter and prescription medicines only as told by your doctor. Keep all follow-up visits. Contact a doctor if: You have a fever or chills. You have new symptoms. Your symptoms get worse. Get help right away if: You have pain in your groin that gets worse all of a sudden. You have a bulge in your groin that: Gets bigger all of a sudden, and it does not get smaller  after that. Turns red or purple. Is painful when you touch it. You are a male, and you have: Sudden pain in your scrotum. A sudden change in the size of your scrotum. You cannot push the hernia back in place by very gently pressing on it when you are lying down. You feel like you may vomit, and that feeling does not go away. You keep vomiting. You have a fast heartbeat. You cannot poop or pass gas. These symptoms may be an emergency. Get help right away. Call your local emergency services (911 in the [place] Do not wait to see if the symptoms will go away. Do not drive yourself to the hospital. Summary An inguinal hernia is when fat or your intestines push through a weak spot in a muscle where your leg meets your lower belly (groin). This causes a bulge. If you do not have symptoms, you may not need treatment. If you have symptoms or a large hernia, you may need surgery. Avoid lifting heavy objects. Also, avoid standing for long amounts of time. Do not try to push the bulge back in if it will not go in easily. This information is not intended to replace advice given to you by your health care provider. Make sure you discuss any questions you have with your health care provider. Document Revised: 07/24/2020 Document Reviewed: 07/24/2020 Elsevier Patient Education  2023 [employer].

## 2022-11-10 NOTE — Progress Notes (Signed)
Outpatient Surgical Follow Up  11/10/2022   Chief Complaint  Patient presents with   Follow-up    Inguinal pain    HPI:  Leon Farmer is an 21 y.o. male seen for chronic inguinodynia.  He did have a CT scan that I have personally reviewed showing no evidence of true inguinal hernia defects.  He endorses that his symptoms are somewhat better and the I seem to have helped some.  He still feels that he has good days and bad days this week actually has been a good week.  He also reports that ever since I did the last physical exam now he is having some mild symptoms on his right side with intermittent pain and similar sensation.  No fevers no chills he is otherwise doing okay  Past Medical History:  Diagnosis Date   Asthma    Asthma    Fracture of wrist     Past Surgical History:  Procedure Laterality Date   NOSE SURGERY      Family History  Problem Relation Age of Onset   Heart disease Other    Diabetes Other     Social History:  reports that he has been smoking. He has never used smokeless tobacco. He reports current drug use. Drug: Marijuana. He reports that he does not drink alcohol.  Allergies:  Allergies  Allergen Reactions   Shrimp [Shellfish Allergy]     Medications reviewed.    ROS Full ROS performed and is otherwise negative other than what is stated in HPI   BP 125/75   Pulse 64   Temp 98.4 F (36.9 C) (Oral)   Wt 216 lb 3.2 oz (98.1 kg)   SpO2 98%   BMI 27.02 kg/m   Physical Exam CONSTITUTIONAL: NAD. EYES: Pupils are equal, round, Sclera are non-icteric. EARS, NOSE, MOUTH AND THROAT: The oropharynx is clear. The oral mucosa is pink and moist. Hearing is intact to voice. LYMPH NODES:  Lymph nodes in the neck are normal. RESPIRATORY:  Lungs are clear. There is normal respiratory effort, with equal breath sounds bilaterally, and without pathologic use of accessory muscles. CARDIOVASCULAR: Heart is regular without murmurs, gallops, or rubs. GI:  The abdomen is  soft, nontender, and nondistended. There are no palpable masses. There is no hepatosplenomegaly. There are normal bowel sounds. The inguinal portion I examined the patient both supine and upright skin for good Valsalva maneuvers.  On the left side there  tenderness to palpation along the inguinal ring inguinal canal.  Exam is limited due to tenderness on the left side.  On the right side I do feel a small hernia.  It is asymptomatic and is not tender. GU: No evidence of testicular or penile lesions or masses. MUSCULOSKELETAL: Normal muscle strength and tone. No cyanosis or edema.   SKIN: Turgor is good and there are no pathologic skin lesions or ulcers. NEUROLOGIC: Motor and sensation is grossly normal. Cranial nerves are grossly intact. PSYCH:  Oriented to person, place and time. Affect is normal.   A/P 21 year-old male with left chronic inguinodynia and now right inguinal pain.  CT scan not diagnostic for inguinal defects.  Discussed with him again about my thought process about chronic inguinodynia.  Diagnosis of sports hernia still within the differentials.  Medical management with eyes NSAIDs and Tylenol did not seem to improve however he seems to be better in the last couple days.  Discussed with him about further diagnosis to include an MRI of the pelvis.  He wishes to continue further workup with the MRI of the pelvis.  Also discussed with him about potential physical therapy and ilioinguinal nerve block along with gabapentin.  At this time he wishes to continue further MRI to the lesion a potential for a sports hernia.  No need for surgical intervention at this time.  Extensive counseling provided I have spent 30 minutes in this encounter including pcounseling the patient ,coordinating heis care, placing orders and performing appropriate documentation    [name], MD [facility] General Surgeon

## 2022-11-17 ENCOUNTER — Ambulatory Visit
Admission: RE | Admit: 2022-11-17 | Discharge: 2022-11-17 | Disposition: A | Discharge status: Home / Self Care | Payer: Self-pay | Source: Ambulatory Visit | Attending: Surgery | Admitting: Surgery

## 2022-11-17 DIAGNOSIS — R1030 Lower abdominal pain, unspecified: Secondary | ICD-10-CM

## 2022-11-17 MED ORDER — GADOBUTROL 1 MMOL/ML IV SOLN
10.0000 mL | Freq: Once | INTRAVENOUS | Status: AC | PRN
  Administered 2022-11-17: 10 mL via INTRAVENOUS

## 2022-11-18 ENCOUNTER — Telehealth: Payer: Self-pay

## 2022-11-18 NOTE — Telephone Encounter (Signed)
Left message to call office-----Per Dr.Pabon CT did not show any tears or anything bad- keep follow up appointment.

## 2022-11-24 ENCOUNTER — Ambulatory Visit: Payer: Self-pay | Admitting: Surgery

## 2022-11-26 ENCOUNTER — Ambulatory Visit: Payer: Self-pay | Admitting: Surgery

## 2022-12-15 ENCOUNTER — Encounter: Payer: Self-pay | Admitting: Surgery

## 2022-12-15 ENCOUNTER — Ambulatory Visit (INDEPENDENT_AMBULATORY_CARE_PROVIDER_SITE_OTHER): Payer: Self-pay | Admitting: Surgery

## 2022-12-15 VITALS — BP 121/72 | HR 59 | Temp 98.0°F | Ht 74.0 in | Wt 216.2 lb

## 2022-12-15 DIAGNOSIS — R1032 Left lower quadrant pain: Secondary | ICD-10-CM

## 2022-12-15 DIAGNOSIS — R1030 Lower abdominal pain, unspecified: Secondary | ICD-10-CM

## 2022-12-15 MED ORDER — TRIAMCINOLONE ACETONIDE 40 MG/ML IJ SUSP
40.0000 mg | Freq: Once | INTRAMUSCULAR | Status: AC
  Administered 2022-12-15: 40 mg

## 2022-12-15 NOTE — Patient Instructions (Addendum)
We will see you back in 2 weeks for a recheck.  Today, we have done an Ileo-Inguinal Nerve Block today. The medication that we used may last hours to several days. If your pain returns, please call our office so that we may give you the next step in pain relief.  You do not need to keep a bandage over the area where this has been done.  You may shower as you normally do.  Please call our office with any questions or concerns that you have.   Peripheral Nerve Block What is a peripheral nerve block? A peripheral nerve block is a method of using a type of medicine that is injected into an area of the body to numb everything below the injection site (regional anesthetic). The medicine is injected around the nerve that provides feeling to the area where you will have a surgical procedure done. A peripheral nerve block is done so that you do not feel any pain during your procedure. You may be numb for up to 24 hours after your peripheral nerve block is done, depending on the type of medicine used.  What are some reasons for having a peripheral nerve block? You may have a peripheral nerve block to relieve pain associated with many types of procedures, such as surgery on any parts of your limbs, your hip, your shoulder, or your head. A peripheral nerve block may be done if you are not able to receive medicine to make you fall asleep (general anesthetic) during your procedure. What are the risks of a peripheral nerve block? Generally, peripheral nerve blocks are safe. However, problems may occur, including: Infection at the injection site. Bleeding. Allergic reactions to medicines. Damage to other structures or organs, such as the nerve that is being blocked. Nerve damage can be temporary or permanent. Bruising. Pain. What are the benefits of a peripheral nerve block? The main benefit of a peripheral nerve block is that you will not feel pain during your procedure, and you will not be exposed to the  risks associated with receiving a general anesthetic. Other benefits may include: Reduced need for pain medicine after your procedure. Fewer side effects from pain medicine that you take after your procedure. Lower risk of blood clots. Faster recovery. How is a peripheral nerve block performed? Your nerve is located by exam. The area near your injection site will be cleaned with a germ-killing (antiseptic) solution. Medicine to numb your injection area (local anesthetic) may be injected into the tissue above your nerve. Regional anesthetic will be injected into the area near your nerve. The medicine will be injected around the nerve, not into it. You should not feel any pain during this injection. The procedure may vary among health care providers and hospitals. How can I expect to feel after a peripheral nerve block? The area where the medicine is injected will be completely numb. You should not feel any pain during your procedure. The area of the peripheral nerve block may continue to feel numb after surgery. As the medicine wears off, feeling will gradually return to the area. When you have a numb body part, you have a greater risk of injuring it because you have no sensation to tell you when something hurts. To reduce your risk of injury when you have a peripheral nerve block: Be very careful when exposing numb body parts to heat or cold. Do not lift heavy items. Do not stand up or try to walk without help if you have a  nerve block in one or both legs. Seek medical care if: You develop redness, swelling, or pain around your injection site. You have fluid or blood coming from your injection site. Your injection site feels warm to the touch. You have pus or a bad smell coming from your injection site. You have pain near your injection site that gets worse. You continue to have numbness, weakness, or tingling after your medicine has worn off. This information is not intended to replace  advice given to you by your health care provider. Make sure you discuss any questions you have with your health care provider. Document Released: 03/02/2008 Document Revised: 10/21/2016 Document Reviewed: 07/31/2015 Elsevier Interactive Patient Education  2017 [employer].

## 2022-12-19 NOTE — Progress Notes (Signed)
Outpatient Surgical Follow Up  12/19/2022  Leon Farmer is an 22 y.o. male.   Chief Complaint  Patient presents with   Follow-up    Left inguinal pain    HPI:   Leon Farmer is an 22 y.o. male seen for chronic inguinodynia.  He did have a CT scan and MRI  that I have personally reviewed showing no evidence of true inguinal hernia defects.  He endorses that his symptoms are somewhat better and the I seem to have helped some.  He still feels that he has good days and bad days this week actually has been a good week.  Continue to have pain / discomofrt on his left side  No fevers no chills he is otherwise doing okay    Past Surgical History:  Procedure Laterality Date   NOSE SURGERY      Family History  Problem Relation Age of Onset   Heart disease Other    Diabetes Other     Social History:  reports that he has been smoking. He has never used smokeless tobacco. He reports current drug use. Drug: Marijuana. He reports that he does not drink alcohol.  Allergies:  Allergies  Allergen Reactions   Shrimp [Shellfish Allergy]     Medications reviewed.    ROS Full ROS performed and is otherwise negative other than what is stated in HPI   BP 121/72   Pulse (!) 59   Temp 98 F (36.7 C) (Oral)   Ht 6\' 2"  (1.88 m)   Wt 216 lb 3.2 oz (98.1 kg)   SpO2 98%   BMI 27.76 kg/m   Physical Exam  CONSTITUTIONAL: NAD. EYES: Pupils are equal, round, Sclera are non-icteric. EARS, NOSE, MOUTH AND THROAT: The oropharynx is clear. The oral mucosa is pink and moist. Hearing is intact to voice. LYMPH NODES:  Lymph nodes in the neck are normal. RESPIRATORY:  Lungs are clear. There is normal respiratory effort, with equal breath sounds bilaterally, and without pathologic use of accessory muscles. CARDIOVASCULAR: Heart is regular without murmurs, gallops, or rubs. GI: The abdomen is  soft, nontender, and nondistended. There are no palpable masses. There is no hepatosplenomegaly.  There are normal bowel sounds. The inguinal portion I examined the patient both supine and upright skin for good Valsalva maneuvers.  On the left side there  tenderness to palpation along the inguinal  inguinal canal.  Exam is limited due to tenderness on the left side. ? .  It is asymptomatic and is not tender. GU: No evidence of testicular or penile lesions or masses. MUSCULOSKELETAL: Normal muscle strength and tone. No cyanosis or edema.   SKIN: Turgor is good and there are no pathologic skin lesions or ulcers. NEUROLOGIC: Motor and sensation is grossly normal. Cranial nerves are grossly intact. PSYCH:  Oriented to person, place and time. Affect is norm   Assessment/Plan: 22 year-old male with left chronic inguinodynia .  CT scan nor MRI  diagnostic for inguinal defects.  Discussed with him again about my thought process about chronic inguinodynia.  Diagnosis of sports hernia still within the differentials.  Also discussed with him about potential physical therapy and ilioinguinal nerve block along with gabapentin.   At this time he wishes to try ilioinguinal nerve block w steroids, procedure d/w the pt in detail and my thought process  No need for surgical intervention at this time.  Extensive counseling provided I have spent 30 minutes in this encounter including pcounseling the patient ,coordinating  heis care, placing orders and performing appropriate documentation    PROCEDURE NOTE Left ilioinguinal N block  Anesthesia: 20 cc marcaine 0.25% w epi and 40 mg kenalog  After informed was obtained pt was preped and draped in the standard fashion . Left ilioinguinal node infiltrated w/o complications. Pt Improved some pressure discomfrot after block/   Caroleen Hamman, MD [place] Surgeon

## 2022-12-29 ENCOUNTER — Other Ambulatory Visit: Payer: Self-pay

## 2022-12-29 ENCOUNTER — Ambulatory Visit (INDEPENDENT_AMBULATORY_CARE_PROVIDER_SITE_OTHER): Payer: Self-pay | Admitting: Surgery

## 2022-12-29 ENCOUNTER — Encounter: Payer: Self-pay | Admitting: Surgery

## 2022-12-29 VITALS — BP 123/79 | HR 62 | Temp 98.3°F | Ht 74.0 in | Wt 212.0 lb

## 2022-12-29 DIAGNOSIS — R1030 Lower abdominal pain, unspecified: Secondary | ICD-10-CM

## 2022-12-29 NOTE — Patient Instructions (Signed)
Referral sent to [facility] physical therapy. Someone from their office will call to schedule an appointment. Please call our office if you do not hear from anyone within 5-7 days.

## 2022-12-31 NOTE — Progress Notes (Signed)
Outpatient Surgical Follow Up  12/31/2022  Leon Farmer is an 22 y.o. male.   Chief Complaint  Patient presents with   Follow-up    HPI: Leon Farmer is an 22 y.o. male seen for chronic inguinodynia.  He did have a CT scan and MRI  that I have personally reviewed showing no evidence of true inguinal hernia defects.   He still feels that he has good days and bad days this week actually has been a good week.  He did have ilioinguinal nerve block a couple weeks ago.  He states that he was better for a couple days but he continues to have similar symptoms.   Past Medical History:  Diagnosis Date   Asthma    Asthma    Fracture of wrist     Past Surgical History:  Procedure Laterality Date   NOSE SURGERY      Family History  Problem Relation Age of Onset   Heart disease Other    Diabetes Other     Social History:  reports that he has been smoking. He has never used smokeless tobacco. He reports current drug use. Drug: Marijuana. He reports that he does not drink alcohol.  Allergies:  Allergies  Allergen Reactions   Shrimp [Shellfish Allergy]     Medications reviewed.    ROS Full ROS performed and is otherwise negative other than what is stated in HPI   BP 123/79   Pulse 62   Temp 98.3 F (36.8 C) (Oral)   Ht 6\' 2"  (1.88 m)   Wt 212 lb (96.2 kg)   SpO2 98%   BMI 27.22 kg/m   Physical Exam GI: The abdomen is  soft, nontender, and nondistended. There are no palpable masses. There is no hepatosplenomegaly. There are normal bowel sounds. The inguinal portion I examined the patient both supine and upright skin for good Valsalva maneuvers.   No true hernias exam is limited due to pain.  There is tenderness  on the left groin GU: No evidence of testicular or penile lesions or masses. MUSCULOSKELETAL: Normal muscle strength and tone. No cyanosis or edema.   SKIN: Turgor is good and there are no pathologic skin lesions or ulcers. NEUROLOGIC: Motor and  sensation is grossly normal. Cranial nerves are grossly intact. PSYCH:  Oriented to person, place and time. 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  CT scan nor MRI  diagnostic for inguinal defects.  Discussed with him again about my thought process about chronic inguinodynia.  Diagnosis of sports hernia still within the differentials.  Also discussed with him about physical therapy .  He wishes to go to physical therapy and try that.  I will see him back in a month or 2. No Need for emergent surgical intervention.  I explained to him that the surgery will be our last option I have spent 20 minutes in this encounter including pcounseling the patient ,coordinating his care, placing orders and performing appropriate documentation     Caroleen Hamman, MD [facility]

## 2023-01-14 ENCOUNTER — Ambulatory Visit (INDEPENDENT_AMBULATORY_CARE_PROVIDER_SITE_OTHER): Payer: Self-pay | Admitting: Surgery

## 2023-01-14 ENCOUNTER — Encounter: Payer: Self-pay | Admitting: Surgery

## 2023-01-14 VITALS — BP 118/70 | HR 71 | Temp 98.8°F | Ht 75.0 in | Wt 204.6 lb

## 2023-01-14 DIAGNOSIS — S3981XD Other specified injuries of abdomen, subsequent encounter: Secondary | ICD-10-CM

## 2023-01-14 DIAGNOSIS — G8929 Other chronic pain: Secondary | ICD-10-CM

## 2023-01-14 DIAGNOSIS — R1032 Left lower quadrant pain: Secondary | ICD-10-CM

## 2023-01-14 NOTE — Patient Instructions (Signed)
Our surgery scheduler Barbara will call you within 24-48 hours to get you scheduled. If you have not heard from her after 48 hours, please call our office. Have the blue sheet available when she calls to write down important information.   If you have any concerns or questions, please feel free to call our office.   Lipoma Removal  Lipoma removal is a surgical procedure to remove a lipoma, which is a noncancerous (benign) tumor that is made up of fat cells. Most lipomas are small and painless and do not require treatment. They can form in many areas of the body but are most common under the skin of the back, arms, shoulders, buttocks, and thighs. You may need lipoma removal if you have a lipoma that is large, growing, or causing discomfort. Lipoma removal may also be done for cosmetic reasons. Tell a health care provider about: Any allergies you have. All medicines you are taking, including vitamins, herbs, eye drops, creams, and over-the-counter medicines. Any problems you or family members have had with anesthetic medicines. Any bleeding problems you have. Any surgeries you have had. Any medical conditions you have. Whether you are pregnant or may be pregnant. What are the risks? Generally, this is a safe procedure. However, problems may occur, including: Infection. Bleeding. Scarring. Allergic reactions to medicines. Damage to nearby structures or organs, such as damage to nerves or blood vessels near the lipoma. What happens before the procedure? When to Stop Eating and Drinking Follow instructions from your health care provider about what you may eat and drink before your procedure. These may include: 8 hours before your procedure Stop eating most foods. Do not eat meat, fried foods, or fatty foods. Eat only light foods, such as toast or crackers. All liquids are okay except energy drinks and alcohol. 6 hours before your procedure Stop eating. Drink only clear liquids, such as  water, clear fruit juice, black coffee, plain tea, and sports drinks. Do not drink energy drinks or alcohol. 2 hours before your procedure Stop drinking all liquids. You may be allowed to take medicines with small sips of water. If you do not follow your health care provider's instructions, your procedure may be delayed or canceled. Medicines Ask your health care provider about: Changing or stopping your regular medicines. This is especially important if you are taking diabetes medicines or blood thinners. Taking medicines such as aspirin and ibuprofen. These medicines can thin your blood. Do not take these medicines unless your health care provider tells you to take them. Taking over-the-counter medicines, vitamins, herbs, and supplements. General instructions You will have a physical exam. Your health care provider will check the size of the lipoma and whether it can be removed easily. You may have a biopsy and imaging tests, such as X-rays, a CT scan, and an MRI. Do not use any products that contain nicotine or tobacco for at least 4 weeks before the procedure. These products include cigarettes, chewing tobacco, and vaping devices, such as e-cigarettes. If you need help quitting, ask your health care provider. Ask your health care provider: How your surgery site will be marked. What steps will be taken to help prevent infection. These may include: Washing skin with a germ-killing soap. Taking antibiotic medicine. If you will be going home right after the procedure, plan to have a responsible adult: Take you home from the hospital or clinic. You will not be allowed to drive. Care for you for the time you are told. What happens during   the procedure?  An IV will be inserted into one of your veins. You will be given one or more of the following: A medicine to help you relax (sedative). A medicine to numb the area (local anesthetic). A medicine to make you fall asleep (general  anesthetic). A medicine that is injected into an area of your body to numb everything below the injection site (regional anesthetic). An incision will be made into the skin over the lipoma or very near the lipoma. The incision may be made in a natural skin line or crease. Tissues, nerves, and blood vessels near the lipoma will be moved out of the way. The lipoma and the capsule that surrounds it will be separated from the surrounding tissues. The lipoma will be removed. The incision may be closed with stitches (sutures). A bandage (dressing) will be placed over the incision. The procedure may vary among health care providers and hospitals. What happens after the procedure? Your blood pressure, heart rate, breathing rate, and blood oxygen level will be monitored until you leave the hospital or clinic. If you were prescribed an antibiotic medicine, use it as told by your health care provider. Do not stop using the antibiotic even if you start to feel better. If you were given a sedative during the procedure, it can affect you for several hours. Do not drive or operate machinery until your health care provider says that it is safe. Where to find more information OrthoInfo: orthoinfo.aaos.org Summary Before the procedure, follow instructions from your health care provider about eating and drinking, and changing or stopping your regular medicines. This is especially important if you are taking diabetes medicines or blood thinners. After the lipoma is removed, the incision may be closed with stitches (sutures) and covered with a bandage (dressing). If you were given a sedative during the procedure, it can affect you for several hours. Do not drive or operate machinery until your health care provider says that it is safe. This information is not intended to replace advice given to you by your health care provider. Make sure you discuss any questions you have with your health care provider. Document  Revised: 12/13/2021 Document Reviewed: 12/13/2021 Elsevier Patient Education  2023 [employer]  

## 2023-01-15 ENCOUNTER — Telehealth: Payer: Self-pay | Admitting: Surgery

## 2023-01-15 NOTE — Telephone Encounter (Signed)
Left message for patient to call, please inform him of the following regarding scheduled surgery with Dr. Dahlia Byes.   Pre-Admission date/time, and Surgery date at [facility].  Surgery Date: 01/29/23 Preadmission Testing Date: 01/26/23 (phone 8a-1p)  Also patient will need to call at [phone], between 1-3:00pm the day before surgery, to find out what time to arrive for surgery.

## 2023-01-16 NOTE — Progress Notes (Signed)
Outpatient Surgical Follow Up  01/16/2023  Leon Farmer is an 22 y.o. male.   Chief Complaint  Patient presents with   Follow-up    Left chronic inguinodynia    HPI: Leon Farmer is a 22 year old male well-known to me with history of left chronic inguinodynia.  I have been seeing him for the last 3 months or so.  We have tried ilioinguinal nerve blocks, anti-inflammatories, ice and gabapentin.  Last visit I did send him for physical therapy and he could not get this done for unclear reasons. He responded briefly to the ilioinguinal nerve block but his symptoms came back.  Had an extensive discussions about options for potential ilioinguinal nerve block and ablation versus potential sports hernia repair.  He has had a CT, MRI failed to reveal a true defect. As have the feeling that there is a structure that comes in and out of his inguinal canal in the left side with discomfort and pain.  Symptoms are worse with exercise, exertion lifting  Past Medical History:  Diagnosis Date   Asthma    Asthma    Fracture of wrist     Past Surgical History:  Procedure Laterality Date   NOSE SURGERY      Family History  Problem Relation Age of Onset   Heart disease Other    Diabetes Other     Social History:  reports that he has been smoking. He has never used smokeless tobacco. He reports current drug use. Drug: Marijuana. He reports that he does not drink alcohol.  Allergies:  Allergies  Allergen Reactions   Shrimp [Shellfish Allergy]     Medications reviewed.    ROS Full ROS performed and is otherwise negative other than what is stated in HPI   BP 118/70   Pulse 71   Temp 98.8 F (37.1 C) (Oral)   Ht 6' 3"$  (1.905 m)   Wt 204 lb 9.6 oz (92.8 kg)   SpO2 98%   BMI 25.57 kg/m   Physical Exam CONSTITUTIONAL: NAD. EYES: Pupils are equal, round, Sclera are non-icteric. EARS, NOSE, MOUTH AND THROAT: The oropharynx is clear. The oral mucosa is pink and moist. Hearing is intact to  voice. LYMPH NODES:  Lymph nodes in the neck are normal. RESPIRATORY:  Lungs are clear. There is normal respiratory effort, with equal breath sounds bilaterally, and without pathologic use of accessory muscles. CARDIOVASCULAR: Heart is regular without murmurs, gallops, or rubs. GI: The abdomen is  soft, nontender, and nondistended. There are no palpable masses. There is no hepatosplenomegaly. There are normal bowel sounds. The inguinal portion I examined the patient both supine and upright skin for good Valsalva maneuvers.  On the left side there is tenderness to palpation along the inguinal ring inguinal canal.  Exam is limited due to tenderness on the left side.   GU: No evidence of testicular or penile lesions or masses. MUSCULOSKELETAL: Normal muscle strength and tone. No cyanosis or edema.   SKIN: Turgor is good and there are no pathologic skin lesions or ulcers. NEUROLOGIC: Motor and sensation is grossly normal. Cranial nerves are grossly intact. PSYCH:  Oriented to person, place and time. Affect is normal.  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  Multiple imaging studies including a CT and MRI have failed to show an actual inguinal defect.  He has been through ilioinguinal nerve blocks, anti-inflammatories, gabapentin and was not able to get physical therapy but I have been basically trying to manage  him nonoperatively for the last 3 months or so. Last option that comes to mind would be a robotic inguinal hernia repair as this has shown to have relief of symptoms in the sports hernias.  I was very candid with him that I could not guarantee that his pain will go away but this is the last tool that I have in my toolbox. He fully understands this and wishes to have repair of the sports hernia.  I do think that we will be able to do a robotic repair safely.  Procedure discussed with him in detail.  Risks, benefits and possible complications  including but not limited to: Bleeding, infection, recurrence, persistent pain as well as bowel injuries and other potential complications.  He fully understands the situation and wishes to proceed.  He also understands that if we were to find a defect intraoperatively on the contralateral side we will plan to fix it as well.  I Spent greater than 40 minutes's encounter including personally reviewing imaging studies, extensive counseling, coordination of his care and placing appropriate documentation.  Caroleen Hamman, MD [facility] Surgeon

## 2023-01-16 NOTE — Telephone Encounter (Signed)
Another message is left for patient to call the office so that we can provide him with the following regarding his scheduled surgery.

## 2023-01-16 NOTE — Telephone Encounter (Signed)
Patient calls back, he is informed of all dates regarding surgery.

## 2023-01-16 NOTE — H&P (View-Only) (Signed)
Outpatient Surgical Follow Up  01/16/2023  Leon Farmer is an 22 y.o. male.   Chief Complaint  Patient presents with   Follow-up    Left chronic inguinodynia    HPI: Leon Farmer is a 22 year old male well-known to me with history of left chronic inguinodynia.  I have been seeing Leon Farmer for the last 3 months or so.  We have tried ilioinguinal nerve blocks, anti-inflammatories, ice and gabapentin.  Last visit I did send Leon Farmer for physical therapy and he could not get this done for unclear reasons. He responded briefly to the ilioinguinal nerve block but his symptoms came back.  Had an extensive discussions about options for potential ilioinguinal nerve block and ablation versus potential sports hernia repair.  He has had a CT, MRI failed to reveal a true defect. As have the feeling that there is a structure that comes in and out of his inguinal canal in the left side with discomfort and pain.  Symptoms are worse with exercise, exertion lifting  Past Medical History:  Diagnosis Date   Asthma    Asthma    Fracture of wrist     Past Surgical History:  Procedure Laterality Date   NOSE SURGERY      Family History  Problem Relation Age of Onset   Heart disease Other    Diabetes Other     Social History:  reports that he has been smoking. He has never used smokeless tobacco. He reports current drug use. Drug: Marijuana. He reports that he does not drink alcohol.  Allergies:  Allergies  Allergen Reactions   Shrimp [Shellfish Allergy]     Medications reviewed.    ROS Full ROS performed and is otherwise negative other than what is stated in HPI   BP 118/70   Pulse 71   Temp 98.8 F (37.1 C) (Oral)   Ht 6' 3"$  (1.905 m)   Wt 204 lb 9.6 oz (92.8 kg)   SpO2 98%   BMI 25.57 kg/m   Physical Exam CONSTITUTIONAL: NAD. EYES: Pupils are equal, round, Sclera are non-icteric. EARS, NOSE, MOUTH AND THROAT: The oropharynx is clear. The oral mucosa is pink and moist. Hearing is intact to  voice. LYMPH NODES:  Lymph nodes in the neck are normal. RESPIRATORY:  Lungs are clear. There is normal respiratory effort, with equal breath sounds bilaterally, and without pathologic use of accessory muscles. CARDIOVASCULAR: Heart is regular without murmurs, gallops, or rubs. GI: The abdomen is  soft, nontender, and nondistended. There are no palpable masses. There is no hepatosplenomegaly. There are normal bowel sounds. The inguinal portion I examined the patient both supine and upright skin for good Valsalva maneuvers.  On the left side there is tenderness to palpation along the inguinal ring inguinal canal.  Exam is limited due to tenderness on the left side.   GU: No evidence of testicular or penile lesions or masses. MUSCULOSKELETAL: Normal muscle strength and tone. No cyanosis or edema.   SKIN: Turgor is good and there are no pathologic skin lesions or ulcers. NEUROLOGIC: Motor and sensation is grossly normal. Cranial nerves are grossly intact. PSYCH:  Oriented to person, place and time. Affect is normal.  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  Multiple imaging studies including a CT and MRI have failed to show an actual inguinal defect.  He has been through ilioinguinal nerve blocks, anti-inflammatories, gabapentin and was not able to get physical therapy but I have been basically trying to manage  Leon Farmer nonoperatively for the last 3 months or so. Last option that comes to mind would be a robotic inguinal hernia repair as this has shown to have relief of symptoms in the sports hernias.  I was very candid with Leon Farmer that I could not guarantee that his pain will go away but this is the last tool that I have in my toolbox. He fully understands this and wishes to have repair of the sports hernia.  I do think that we will be able to do a robotic repair safely.  Procedure discussed with Leon Farmer in detail.  Risks, benefits and possible complications  including but not limited to: Bleeding, infection, recurrence, persistent pain as well as bowel injuries and other potential complications.  He fully understands the situation and wishes to proceed.  He also understands that if we were to find a defect intraoperatively on the contralateral side we will plan to fix it as well.  I Spent greater than 40 minutes's encounter including personally reviewing imaging studies, extensive counseling, coordination of his care and placing appropriate documentation.  Caroleen Hamman, MD [facility] General Surgeon

## 2023-01-26 ENCOUNTER — Encounter
Admission: RE | Admit: 2023-01-26 | Discharge: 2023-01-26 | Disposition: A | Discharge status: Home / Self Care | Payer: Self-pay | Source: Ambulatory Visit | Attending: Surgery | Admitting: Surgery

## 2023-01-26 HISTORY — DX: Unilateral inguinal hernia, without obstruction or gangrene, not specified as recurrent: K40.90

## 2023-01-26 HISTORY — DX: Cannabis use, unspecified, uncomplicated: F12.90

## 2023-01-26 HISTORY — DX: Dermatitis, unspecified: L30.9

## 2023-01-26 NOTE — Patient Instructions (Addendum)
Your procedure is scheduled on:01-29-23 Thursday Report to the Registration Desk on the 1st floor of the [facility].Then proceed to the 2nd floor Surgery Desk To find out your arrival time, please call [phone] between 1PM - 3PM on:01-28-23 Wednesday If your arrival time is 6:00 am, do not arrive before that time as the [facility] entrance doors do not open until 6:00 am.  REMEMBER: Instructions that are not followed completely may result in serious medical risk, up to and including death; or upon the discretion of your surgeon and anesthesiologist your surgery may need to be rescheduled.  Do not eat food after midnight the night before surgery.  No gum chewing or hard candies.  You may however, drink CLEAR liquids up to 2 hours before you are scheduled to arrive for your surgery. Do not drink anything within 2 hours of your scheduled arrival time.  Clear liquids include: - water  - apple juice without pulp - gatorade (not RED colors) - black coffee or tea (Do NOT add milk or creamers to the coffee or tea) Do NOT drink anything that is not on this list  One week prior to surgery: Stop Anti-inflammatories (NSAIDS) such as Advil, Aleve, Ibuprofen, Motrin, Naproxen, Naprosyn and Aspirin based products such as Excedrin, Goody's Powder, BC Powder.You may however, take Tylenol if needed for pain up until the day of surgery.  Stop ANY OVER THE COUNTER supplements/vitamins NOW (01-26-23) until after surgery.  Do NOT take any medication the day of surgery  Bring your Albuterol Inhaler to the hospital the day of surgery  No Alcohol for 24 hours before or after surgery.  No Smoking including e-cigarettes for 24 hours before surgery.  No chewable tobacco products for at least 6 hours before surgery.  No nicotine patches on the day of surgery.  Do not use any "recreational" drugs for at least a week (preferably 2 weeks) before your surgery.  Please be advised that the combination of  cocaine and anesthesia may have negative outcomes, up to and including death. If you test positive for cocaine, your surgery will be cancelled.  On the morning of surgery brush your teeth with toothpaste and water, you may rinse your mouth with mouthwash if you wish. Do not swallow any toothpaste or mouthwash.  Use CHG Soap as directed on instruction sheet.  Do not wear jewelry, make-up, hairpins, clips or nail polish.  Do not wear lotions, powders, or perfumes.   Do not shave body hair from the neck down 48 hours before surgery.  Contact lenses, hearing aids and dentures may not be worn into surgery.  Do not bring valuables to the hospital. [facility] is not responsible for any missing/lost belongings or valuables.   Notify your doctor if there is any change in your medical condition (cold, fever, infection).  Wear comfortable clothing (specific to your surgery type) to the hospital.  After surgery, you can help prevent lung complications by doing breathing exercises.  Take deep breaths and cough every 1-2 hours. Your doctor may order a device called an Incentive Spirometer to help you take deep breaths. When coughing or sneezing, hold a pillow firmly against your incision with both hands. This is called "splinting." Doing this helps protect your incision. It also decreases belly discomfort.  If you are being admitted to the hospital overnight, leave your suitcase in the car. After surgery it may be brought to your room.  In case of increased patient census, it may be necessary for  you, the patient, to continue your postoperative care in the Same Day Surgery department.  If you are being discharged the day of surgery, you will not be allowed to drive home. You will need a responsible individual to drive you home and stay with you for 24 hours after surgery.   If you are taking public transportation, you will need to have a responsible individual with you.  Please call the  [facility]. at [phone] if you have any questions about these instructions.  Surgery Visitation Policy:  Patients undergoing a surgery or procedure may have two family members or support persons with them as long as the person is not COVID-19 positive or experiencing its symptoms.   Due to an increase in RSV and influenza rates and associated hospitalizations, children ages 31 and under will not be able to visit patients in [facility]. Masks continue to be strongly recommended.    Preparing for Surgery with CHLORHEXIDINE GLUCONATE (CHG) Soap  Chlorhexidine Gluconate (CHG) Soap  o An antiseptic cleaner that kills germs and bonds with the skin to continue killing germs even after washing  o Used for showering the night before surgery and morning of surgery  Before surgery, you can play an important role by reducing the number of germs on your skin.  CHG (Chlorhexidine gluconate) soap is an antiseptic cleanser which kills germs and bonds with the skin to continue killing germs even after washing.  Please do not use if you have an allergy to CHG or antibacterial soaps. If your skin becomes reddened/irritated stop using the CHG.  1. Shower the NIGHT BEFORE SURGERY and the MORNING OF SURGERY with CHG soap.  2. If you choose to wash your hair, wash your hair first as usual with your normal shampoo.  3. After shampooing, rinse your hair and body thoroughly to remove the shampoo.  4. Use CHG as you would any other liquid soap. You can apply CHG directly to the skin and wash gently with a scrungie or a clean washcloth.  5. Apply the CHG soap to your body only from the neck down. Do not use on open wounds or open sores. Avoid contact with your eyes, ears, mouth, and genitals (private parts). Wash face and genitals (private parts) with your normal soap.  6. Wash thoroughly, paying special attention to the area where your surgery will be performed.  7.  Thoroughly rinse your body with warm water.  8. Do not shower/wash with your normal soap after using and rinsing off the CHG soap.  9. Pat yourself dry with a clean towel.  10. Wear clean pajamas to bed the night before surgery.  12. Place clean sheets on your bed the night of your first shower and do not sleep with pets.  13. Shower again with the CHG soap on the day of surgery prior to arriving at the hospital.  14. Do not apply any deodorants/lotions/powders.  15. Please wear clean clothes to the hospital.

## 2023-01-29 ENCOUNTER — Other Ambulatory Visit: Payer: Self-pay

## 2023-01-29 ENCOUNTER — Ambulatory Visit: Payer: Self-pay | Admitting: Certified Registered"

## 2023-01-29 ENCOUNTER — Ambulatory Visit
Admission: RE | Admit: 2023-01-29 | Discharge: 2023-01-29 | Disposition: A | Discharge status: Home / Self Care | Payer: Self-pay | Source: Ambulatory Visit | Attending: Surgery | Admitting: Surgery

## 2023-01-29 ENCOUNTER — Encounter
Admission: RE | Disposition: A | Discharge status: Home / Self Care | Payer: Self-pay | Source: Ambulatory Visit | Attending: Surgery

## 2023-01-29 ENCOUNTER — Encounter: Payer: Self-pay | Admitting: Surgery

## 2023-01-29 DIAGNOSIS — F17200 Nicotine dependence, unspecified, uncomplicated: Secondary | ICD-10-CM | POA: Insufficient documentation

## 2023-01-29 DIAGNOSIS — J45909 Unspecified asthma, uncomplicated: Secondary | ICD-10-CM | POA: Insufficient documentation

## 2023-01-29 DIAGNOSIS — S3981XD Other specified injuries of abdomen, subsequent encounter: Secondary | ICD-10-CM

## 2023-01-29 DIAGNOSIS — S3981XA Other specified injuries of abdomen, initial encounter: Secondary | ICD-10-CM

## 2023-01-29 DIAGNOSIS — K4090 Unilateral inguinal hernia, without obstruction or gangrene, not specified as recurrent: Secondary | ICD-10-CM | POA: Insufficient documentation

## 2023-01-29 HISTORY — PX: INSERTION OF MESH: SHX5868

## 2023-01-29 SURGERY — HERNIORRHAPHY, INGUINAL, ROBOT-ASSISTED, LAPAROSCOPIC
Anesthesia: General | Laterality: Left

## 2023-01-29 MED ORDER — OXYCODONE HCL 5 MG/5ML PO SOLN: 5.0000 mg | Freq: Once | ORAL | Status: AC | PRN

## 2023-01-29 MED ORDER — ACETAMINOPHEN 500 MG PO TABS
ORAL_TABLET | ORAL | Status: AC
  Administered 2023-01-29: 1000 mg via ORAL
  Filled 2023-01-29: qty 2

## 2023-01-29 MED ORDER — CEFAZOLIN SODIUM-DEXTROSE 2-4 GM/100ML-% IV SOLN
2.0000 g | INTRAVENOUS | Status: AC
  Administered 2023-01-29: 2 g via INTRAVENOUS

## 2023-01-29 MED ORDER — GABAPENTIN 300 MG PO CAPS
ORAL_CAPSULE | ORAL | Status: AC
  Administered 2023-01-29: 300 mg via ORAL
  Filled 2023-01-29: qty 1

## 2023-01-29 MED ORDER — CHLORHEXIDINE GLUCONATE CLOTH 2 % EX PADS: 6.0000 | MEDICATED_PAD | Freq: Once | CUTANEOUS | Status: DC

## 2023-01-29 MED ORDER — GLYCOPYRROLATE 0.2 MG/ML IJ SOLN
INTRAMUSCULAR | Status: DC | PRN
  Administered 2023-01-29: .2 mg via INTRAVENOUS

## 2023-01-29 MED ORDER — FENTANYL CITRATE (PF) 100 MCG/2ML IJ SOLN
INTRAMUSCULAR | Status: DC | PRN
  Administered 2023-01-29: 100 ug via INTRAVENOUS

## 2023-01-29 MED ORDER — CEFAZOLIN SODIUM-DEXTROSE 2-4 GM/100ML-% IV SOLN
INTRAVENOUS | Status: AC
  Filled 2023-01-29: qty 100

## 2023-01-29 MED ORDER — FAMOTIDINE 20 MG PO TABS
ORAL_TABLET | ORAL | Status: AC
  Administered 2023-01-29: 20 mg via ORAL
  Filled 2023-01-29: qty 1

## 2023-01-29 MED ORDER — ORAL CARE MOUTH RINSE: 15.0000 mL | Freq: Once | OROMUCOSAL | Status: AC

## 2023-01-29 MED ORDER — GABAPENTIN 300 MG PO CAPS: 300.0000 mg | ORAL_CAPSULE | ORAL | Status: AC

## 2023-01-29 MED ORDER — CHLORHEXIDINE GLUCONATE 0.12 % MT SOLN: 15.0000 mL | Freq: Once | OROMUCOSAL | Status: AC

## 2023-01-29 MED ORDER — FENTANYL CITRATE (PF) 100 MCG/2ML IJ SOLN
INTRAMUSCULAR | Status: AC
  Filled 2023-01-29: qty 2

## 2023-01-29 MED ORDER — HYDROMORPHONE HCL 1 MG/ML IJ SOLN
INTRAMUSCULAR | Status: AC
  Filled 2023-01-29: qty 1

## 2023-01-29 MED ORDER — CELECOXIB 200 MG PO CAPS
ORAL_CAPSULE | ORAL | Status: AC
  Administered 2023-01-29: 200 mg via ORAL
  Filled 2023-01-29: qty 1

## 2023-01-29 MED ORDER — CHLORHEXIDINE GLUCONATE 0.12 % MT SOLN
OROMUCOSAL | Status: AC
  Administered 2023-01-29: 15 mL via OROMUCOSAL
  Filled 2023-01-29: qty 15

## 2023-01-29 MED ORDER — PROPOFOL 10 MG/ML IV BOLUS
INTRAVENOUS | Status: AC
  Filled 2023-01-29: qty 40

## 2023-01-29 MED ORDER — MIDAZOLAM HCL 2 MG/2ML IJ SOLN
INTRAMUSCULAR | Status: DC | PRN
  Administered 2023-01-29: 2 mg via INTRAVENOUS

## 2023-01-29 MED ORDER — CEFAZOLIN SODIUM-DEXTROSE 2-3 GM-%(50ML) IV SOLR: INTRAVENOUS | Status: DC | PRN

## 2023-01-29 MED ORDER — MIDAZOLAM HCL 2 MG/2ML IJ SOLN
INTRAMUSCULAR | Status: AC
  Filled 2023-01-29: qty 2

## 2023-01-29 MED ORDER — FENTANYL CITRATE (PF) 100 MCG/2ML IJ SOLN: 25.0000 ug | INTRAMUSCULAR | Status: DC | PRN

## 2023-01-29 MED ORDER — DEXMEDETOMIDINE HCL IN NACL 80 MCG/20ML IV SOLN
INTRAVENOUS | Status: DC | PRN
  Administered 2023-01-29 (×2): 8 ug via BUCCAL
  Administered 2023-01-29: 4 ug via BUCCAL
  Administered 2023-01-29: 8 ug via BUCCAL
  Administered 2023-01-29: 12 ug via BUCCAL

## 2023-01-29 MED ORDER — DEXAMETHASONE SODIUM PHOSPHATE 10 MG/ML IJ SOLN
INTRAMUSCULAR | Status: DC | PRN
  Administered 2023-01-29: 10 mg via INTRAVENOUS

## 2023-01-29 MED ORDER — PROPOFOL 10 MG/ML IV BOLUS
INTRAVENOUS | Status: DC | PRN
  Administered 2023-01-29: 200 mg via INTRAVENOUS
  Administered 2023-01-29: 50 mg via INTRAVENOUS

## 2023-01-29 MED ORDER — SUGAMMADEX SODIUM 200 MG/2ML IV SOLN: INTRAVENOUS | Status: DC | PRN

## 2023-01-29 MED ORDER — ACETAMINOPHEN 500 MG PO TABS: 1000.0000 mg | ORAL_TABLET | ORAL | Status: AC

## 2023-01-29 MED ORDER — BUPIVACAINE LIPOSOME 1.3 % IJ SUSP
INTRAMUSCULAR | Status: AC
  Filled 2023-01-29: qty 20

## 2023-01-29 MED ORDER — BUPIVACAINE HCL (PF) 0.25 % IJ SOLN
INTRAMUSCULAR | Status: AC
  Filled 2023-01-29: qty 30

## 2023-01-29 MED ORDER — FAMOTIDINE 20 MG PO TABS: 20.0000 mg | ORAL_TABLET | Freq: Once | ORAL | Status: AC

## 2023-01-29 MED ORDER — HYDROCODONE-ACETAMINOPHEN 5-325 MG PO TABS: 1.0000 | ORAL_TABLET | ORAL | 0 refills | Status: DC | PRN

## 2023-01-29 MED ORDER — LACTATED RINGERS IV SOLN: INTRAVENOUS | Status: DC | PRN

## 2023-01-29 MED ORDER — OXYCODONE HCL 5 MG PO TABS: 5.0000 mg | ORAL_TABLET | Freq: Once | ORAL | Status: AC | PRN

## 2023-01-29 MED ORDER — LIDOCAINE HCL (CARDIAC) PF 100 MG/5ML IV SOSY
PREFILLED_SYRINGE | INTRAVENOUS | Status: DC | PRN
  Administered 2023-01-29: 100 mg via INTRAVENOUS

## 2023-01-29 MED ORDER — SUGAMMADEX SODIUM 200 MG/2ML IV SOLN
INTRAVENOUS | Status: DC | PRN
  Administered 2023-01-29: 200 mg via INTRAVENOUS

## 2023-01-29 MED ORDER — OXYCODONE HCL 5 MG PO TABS
ORAL_TABLET | ORAL | Status: AC
  Administered 2023-01-29: 5 mg via ORAL
  Filled 2023-01-29: qty 1

## 2023-01-29 MED ORDER — HYDROMORPHONE HCL 1 MG/ML IJ SOLN
INTRAMUSCULAR | Status: DC | PRN
  Administered 2023-01-29: .5 mg via INTRAVENOUS

## 2023-01-29 MED ORDER — ROCURONIUM BROMIDE 100 MG/10ML IV SOLN
INTRAVENOUS | Status: DC | PRN
  Administered 2023-01-29 (×2): 50 mg via INTRAVENOUS

## 2023-01-29 MED ORDER — ONDANSETRON HCL 4 MG/2ML IJ SOLN
INTRAMUSCULAR | Status: DC | PRN
  Administered 2023-01-29: 4 mg via INTRAVENOUS

## 2023-01-29 MED ORDER — CELECOXIB 200 MG PO CAPS: 200.0000 mg | ORAL_CAPSULE | ORAL | Status: AC

## 2023-01-29 MED ORDER — LACTATED RINGERS IV SOLN: INTRAVENOUS | Status: DC

## 2023-01-29 SURGICAL SUPPLY — 48 items
ADH SKN CLS APL DERMABOND .7 (GAUZE/BANDAGES/DRESSINGS) ×2
CANNULA REDUC XI 12-8 STAPL (CANNULA) ×2
CANNULA REDUCER 12-8 DVNC XI (CANNULA) ×3 IMPLANT
COVER TIP SHEARS 8 DVNC (MISCELLANEOUS) ×3 IMPLANT
COVER TIP SHEARS 8MM DA VINCI (MISCELLANEOUS) ×2
COVER WAND RF STERILE (DRAPES) ×3 IMPLANT
DERMABOND ADVANCED .7 DNX12 (GAUZE/BANDAGES/DRESSINGS) ×3 IMPLANT
DRAPE ARM DVNC X/XI (DISPOSABLE) ×9 IMPLANT
DRAPE COLUMN DVNC XI (DISPOSABLE) ×3 IMPLANT
DRAPE DA VINCI XI ARM (DISPOSABLE) ×6
DRAPE DA VINCI XI COLUMN (DISPOSABLE) ×2
ELECT CAUTERY BLADE 6.4 (BLADE) ×3 IMPLANT
ELECT REM PT RETURN 9FT ADLT (ELECTROSURGICAL) ×2
ELECTRODE REM PT RTRN 9FT ADLT (ELECTROSURGICAL) ×3 IMPLANT
GLOVE BIO SURGEON STRL SZ7 (GLOVE) ×12 IMPLANT
GOWN STRL REUS W/ TWL LRG LVL3 (GOWN DISPOSABLE) ×12 IMPLANT
GOWN STRL REUS W/TWL LRG LVL3 (GOWN DISPOSABLE) ×6
IRRIGATION STRYKERFLOW (MISCELLANEOUS) ×3 IMPLANT
IRRIGATOR STRYKERFLOW (MISCELLANEOUS) ×2
IV NS 1000ML (IV SOLUTION)
IV NS 1000ML BAXH (IV SOLUTION) IMPLANT
KIT PINK PAD W/HEAD ARE REST (MISCELLANEOUS) ×2
KIT PINK PAD W/HEAD ARM REST (MISCELLANEOUS) ×3 IMPLANT
LABEL OR SOLS (LABEL) ×3 IMPLANT
MANIFOLD NEPTUNE II (INSTRUMENTS) ×3 IMPLANT
MESH 3DMAX 4X6 LT LRG (Mesh General) IMPLANT
NEEDLE HYPO 22GX1.5 SAFETY (NEEDLE) ×3 IMPLANT
OBTURATOR OPTICAL STANDARD 8MM (TROCAR) ×2
OBTURATOR OPTICAL STND 8 DVNC (TROCAR) ×2
OBTURATOR OPTICALSTD 8 DVNC (TROCAR) ×3 IMPLANT
PACK LAP CHOLECYSTECTOMY (MISCELLANEOUS) ×3 IMPLANT
PENCIL SMOKE EVACUATOR (MISCELLANEOUS) ×3 IMPLANT
SEAL CANN UNIV 5-8 DVNC XI (MISCELLANEOUS) ×6 IMPLANT
SEAL XI 5MM-8MM UNIVERSAL (MISCELLANEOUS) ×4
SET TUBE SMOKE EVAC HIGH FLOW (TUBING) ×3 IMPLANT
SOL ELECTROSURG ANTI STICK (MISCELLANEOUS) ×2
SOLUTION ELECTROSURG ANTI STCK (MISCELLANEOUS) ×3 IMPLANT
SPONGE T-LAP 18X18 ~~LOC~~+RFID (SPONGE) ×3 IMPLANT
STAPLER CANNULA SEAL DVNC XI (STAPLE) ×3 IMPLANT
STAPLER CANNULA SEAL XI (STAPLE) ×2
SUT MNCRL AB 4-0 PS2 18 (SUTURE) ×3 IMPLANT
SUT V-LOC 90 ABS 3-0 VLT  V-20 (SUTURE) ×2
SUT V-LOC 90 ABS 3-0 VLT V-20 (SUTURE) ×6 IMPLANT
SUT VICRYL 0 UR6 27IN ABS (SUTURE) ×6 IMPLANT
SYR 30ML LL (SYRINGE) ×3 IMPLANT
TAPE TRANSPORE STRL 2 31045 (GAUZE/BANDAGES/DRESSINGS) ×3 IMPLANT
TRAP FLUID SMOKE EVACUATOR (MISCELLANEOUS) ×3 IMPLANT
WATER STERILE IRR 500ML POUR (IV SOLUTION) ×3 IMPLANT

## 2023-01-29 NOTE — Anesthesia Preprocedure Evaluation (Signed)
Anesthesia Evaluation  Patient identified by MRN, date of birth, ID band Patient awake    Reviewed: Allergy & Precautions, NPO status , Patient's Chart, lab work & pertinent test results  History of Anesthesia Complications Negative for: history of anesthetic complications  Airway Mallampati: III  TM Distance: >3 FB Neck ROM: full    Dental  (+) Chipped, Poor Dentition, Caps Temp crown:   Pulmonary neg shortness of breath, asthma , Current Smoker and Patient abstained from smoking.   Pulmonary exam normal        Cardiovascular Exercise Tolerance: Good (-) angina (-) Past MI negative cardio ROS Normal cardiovascular exam     Neuro/Psych negative neurological ROS  negative psych ROS   GI/Hepatic negative GI ROS, Neg liver ROS,neg GERD  ,,  Endo/Other  negative endocrine ROS    Renal/GU      Musculoskeletal   Abdominal   Peds  Hematology negative hematology ROS (+)   Anesthesia Other Findings Past Medical History: No date: Asthma     Comment:  has not had asthma attack since 2020 No date: Eczema No date: Fracture of wrist No date: Inguinal hernia     Comment:  left No date: Marijuana use  Past Surgical History: 2019: NOSE SURGERY  BMI    Body Mass Index: 26.25 kg/m      Reproductive/Obstetrics negative OB ROS                             Anesthesia Physical Anesthesia Plan  ASA: 2  Anesthesia Plan: General ETT   Post-op Pain Management:    Induction: Intravenous  PONV Risk Score and Plan: Ondansetron, Dexamethasone, Midazolam and Treatment may vary due to age or medical condition  Airway Management Planned: Oral ETT  Additional Equipment:   Intra-op Plan:   Post-operative Plan: Extubation in OR  Informed Consent: I have reviewed the patients History and Physical, chart, labs and discussed the procedure including the risks, benefits and alternatives for the  proposed anesthesia with the patient or authorized representative who has indicated his/her understanding and acceptance.     Dental Advisory Given  Plan Discussed with: Anesthesiologist, CRNA and Surgeon  Anesthesia Plan Comments: (Patient consented for risks of anesthesia including but not limited to:  - adverse reactions to medications - damage to eyes, teeth, lips or other oral mucosa - nerve damage due to positioning  - sore throat or hoarseness - Damage to heart, brain, nerves, lungs, other parts of body or loss of life  Patient voiced understanding.)       Anesthesia Quick Evaluation

## 2023-01-29 NOTE — Discharge Instructions (Addendum)
Laparoscopic Inguinal Hernia Repair, Adult, Care After The following information offers guidance on how to care for yourself after your procedure. Your health care provider may also give you more specific instructions. If you have problems or questions, contact your health care provider. What can I expect after the procedure? After the procedure, it is common to have: Pain. Swelling and bruising around the incision area. Scrotal swelling, in males. Some fluid or blood draining from your incisions. Follow these instructions at home: Medicines Take over-the-counter and prescription medicines only as told by your health care provider. Ask your health care provider if the medicine prescribed to you: Requires you to avoid driving or using machinery. Can cause constipation. You may need to take these actions to prevent or treat constipation: Drink enough fluid to keep your urine pale yellow. Take over-the-counter or prescription medicines. Eat foods that are high in fiber, such as beans, whole grains, and fresh fruits and vegetables. Limit foods that are high in fat and processed sugars, such as fried or sweet foods. Incision care  Follow instructions from your health care provider about how to take care of your incisions. Make sure you: Wash your hands with soap and water for at least 20 seconds before and after you change your bandage (dressing). If soap and water are not available, use hand sanitizer. Change your dressing as told by your health care provider. Leave stitches (sutures), skin glue, or adhesive strips in place. These skin closures may need to stay in place for 2 weeks or longer. If adhesive strip edges start to loosen and curl up, you may trim the loose edges. Do not remove adhesive strips completely unless your health care provider tells you to do that. Check your incision area every day for signs of infection. Check for: More redness, swelling, or pain. More fluid or  blood. Warmth. Pus or a bad smell. Wear loose, soft clothing while your incisions heal. Managing pain and swelling  If directed, put ice on the painful or swollen areas. To do this: Put ice in a plastic bag. Place a towel between your skin and the bag. Leave the ice on for 20 minutes, 2-3 times a day. Remove the ice if your skin turns bright red. This is very important. If you cannot feel pain, heat, or cold, you have a greater risk of damage to the area.   Activity Do not lift anything that is heavier than 10 lb (4.5 kg), or the limit that you are told, until your health care provider says that it is safe. Ask your health care provider what activities are safe for you. A lot of activity during the first week after surgery can increase pain and swelling. For 1 week after your procedure: Avoid activities that take a lot of effort, such as exercise or sports. You may walk and climb stairs as needed for daily activity, but avoid long walks or climbing stairs for exercise. General instructions If you were given a sedative during the procedure, it can affect you for several hours. Do not drive or operate machinery until your health care provider says that it is safe. Do not take baths, swim, or use a hot tub until your health care provider approves. Ask your health care provider if you may take showers. You may only be allowed to take sponge baths. Do not use any products that contain nicotine or tobacco. These products include cigarettes, chewing tobacco, and vaping devices, such as e-cigarettes. If you need help quitting, ask  your health care provider. Keep all follow-up visits. This is important. Contact a health care provider if: You have any of these signs of infection: More redness, swelling, or pain around your incisions or your groin area. More fluid or blood coming from an incision. Warmth coming from an incision. Pus or a bad smell coming from an incision. A fever or chills. You  have more swelling in your scrotum, if you are male. You have severe pain and medicines do not help. You have abdominal pain or swelling. You cannot urinate or have a bowel movement. You faint or feel dizzy. You have nausea and vomiting. Get help right away if: You have redness, warmth, or pain in your leg. You have chest pain. You have problems breathing. These symptoms may represent a serious problem that is an emergency. Do not wait to see if the symptoms will go away. Get medical help right away. Call your local emergency services (911 in the [place] Do not drive yourself to the hospital. Summary Pain, swelling, and bruising are common after the procedure. Check your incision area every day for signs of infection, such as more redness, swelling, or pain. Put ice on painful or swollen areas for 20 minutes, 2-3 times a day. This information is not intended to replace advice given to you by your health care provider. Make sure you discuss any questions you have with your health care provider. Document Revised: 07/24/2020 Document Reviewed: 07/24/2020 Elsevier Patient Education  [place]   The drugs that you were given will stay in your system until tomorrow so for the next 24 hours you should not:  Drive an automobile Make any legal decisions Drink any alcoholic beverage   You may resume regular meals tomorrow.  Today it is better to start with liquids and gradually work up to solid foods.  You may eat anything you prefer, but it is better to start with liquids, then soup and crackers, and gradually work up to solid foods.   Please notify your doctor immediately if you have any unusual bleeding, trouble breathing, redness and pain at the surgery site, drainage, fever, or pain not relieved by medication.    Additional Instructions:        Please contact your physician with any problems or Same Day Surgery at  [phone], Monday through Friday 6 am to 4 pm, or [place] at [facility] number at [phone].

## 2023-01-29 NOTE — Transfer of Care (Signed)
Immediate Anesthesia Transfer of Care Note  Patient: Leon Farmer  Procedure(s) Performed: XI ROBOTIC ASSISTED INGUINAL HERNIA, possible bilateral (Left) INSERTION OF MESH  Patient Location: PACU  Anesthesia Type:General  Level of Consciousness: drowsy  Airway & Oxygen Therapy: Patient Spontanous Breathing and Patient connected to face mask oxygen  Post-op Assessment: Report given to RN and Post -op Vital signs reviewed and stable  Post vital signs: Reviewed  Last Vitals:  Vitals Value Taken Time  BP 126/85 01/29/23 1200  Temp 36.2 C 01/29/23 1155  Pulse 64 01/29/23 1202  Resp 14 01/29/23 1202  SpO2 100 % 01/29/23 1202  Vitals shown include unvalidated device data.  Last Pain:  Vitals:   01/29/23 1155  TempSrc:   PainSc: Asleep         Complications: No notable events documented.

## 2023-01-29 NOTE — Interval H&P Note (Signed)
History and Physical Interval Note:  01/29/2023 9:55 AM  Leon Farmer  has presented today for surgery, with the diagnosis of inguinal hernia.  The various methods of treatment have been discussed with the patient and family. After consideration of risks, benefits and other options for treatment, the patient has consented to  Procedure(s): XI ROBOTIC ASSISTED INGUINAL HERNIA, possible bilateral (Left) as a surgical intervention.  The patient's history has been reviewed, patient examined, no change in status, stable for surgery.  I have reviewed the patient's chart and labs.  Questions were answered to the patient's satisfaction.     [place]

## 2023-01-29 NOTE — Anesthesia Procedure Notes (Signed)
Procedure Name: Intubation Date/Time: 01/29/2023 10:30 AM  Performed by: Otho Perl, CRNAPre-anesthesia Checklist: Patient identified, Patient being monitored, Timeout performed, Emergency Drugs available and Suction available Patient Re-evaluated:Patient Re-evaluated prior to induction Oxygen Delivery Method: Circle system utilized Preoxygenation: Pre-oxygenation with 100% oxygen Induction Type: IV induction Ventilation: Mask ventilation without difficulty Laryngoscope Size: Mac and 3 Grade View: Grade I Tube type: Oral Tube size: 7.5 mm Number of attempts: 1 Airway Equipment and Method: Stylet Placement Confirmation: ETT inserted through vocal cords under direct vision, positive ETCO2 and breath sounds checked- equal and bilateral Secured at: 21 cm Tube secured with: Tape Dental Injury: Teeth and Oropharynx as per pre-operative assessment

## 2023-01-29 NOTE — Anesthesia Postprocedure Evaluation (Signed)
Anesthesia Post Note  Patient: Leon Farmer  Procedure(s) Performed: XI ROBOTIC ASSISTED INGUINAL HERNIA, possible bilateral (Left) INSERTION OF MESH  Patient location during evaluation: PACU Anesthesia Type: General Level of consciousness: awake and alert Pain management: pain level controlled Vital Signs Assessment: post-procedure vital signs reviewed and stable Respiratory status: spontaneous breathing, nonlabored ventilation, respiratory function stable and patient connected to nasal cannula oxygen Cardiovascular status: blood pressure returned to baseline and stable Postop Assessment: no apparent nausea or vomiting Anesthetic complications: no   No notable events documented.   Last Vitals:  Vitals:   01/29/23 1240 01/29/23 1245  BP:  132/87  Pulse: 72 68  Resp: (!) 28 16  Temp:  (!) 36.3 C  SpO2: 100% 100%    Last Pain:  Vitals:   01/29/23 1245  TempSrc:   PainSc: 3                  Precious Haws Leon Farmer

## 2023-01-29 NOTE — Op Note (Signed)
Robotic assisted Laparoscopic Transabdominal Left Inguinal Hernia Repair with 3 D large Mesh      Pre-operative Diagnosis:  Sports Inguinal Hernia   Post-operative Diagnosis: Same, small indirect defect   Procedure: Robotic  Laparoscopic  repair of left inguinal hernia   Surgeon: Caroleen Hamman, MD FACS   Anesthesia: Gen. with endotracheal tube   Findings:Tiny left inguinal hernia        Procedure Details  The patient was seen again in the Holding Room. The benefits, complications, treatment options, and expected outcomes were discussed with the patient. The risks of bleeding, infection, recurrence of symptoms, failure to resolve symptoms, recurrence of hernia, ischemic orchitis, chronic pain syndrome or neuroma, were discussed again. The likelihood of improving the patient's symptoms with return to their baseline status is good.  The patient and/or family concurred with the proposed plan, giving informed consent.  The patient was taken to Operating Room, identified  and the procedure verified as Laparoscopic Inguinal Hernia Repair. Laterality confirmed.  A Time Out was held and the above information confirmed.   Prior to the induction of general anesthesia, antibiotic prophylaxis was administered. VTE prophylaxis was in place. General endotracheal anesthesia was then administered and tolerated well. After the induction, the abdomen was prepped with Chloraprep and draped in the sterile fashion. The patient was positioned in the supine position.     Supraumbilical incision created and cut down technique used to enter the abdominal cavity. Fascia elevated and incised and hasson trochar placed. Pneumoperitoneum obtained w/o HD changes. No evidence of bowel injuries.  Two 8 mm placed under direct vision. The laparoscopy revealed a tiny left indirect defect. I inserted the needles and the mesh. The robot was brought ot the table and docked in the standard fashion, no collision between arms was  observed. Instruments were kept under direct view at all times.  Attention then was turned to the left side where a peritoneal flap was created. The sac was reduced and dissected free from adjacent structures. Lipoma was reduced as well. We preserved the vas and the vessels. Once dissection was completed a large 3D mesh was placed and secured with two interrupted vicryl attached to the pubic tubercle. There was good coverage of the direct, indirect and femoral spaces. The flap was closed with v lock suture. Second look revealed no complications or injuries.    Once assuring that hemostasis was adequate the ports were removed and a figure-of-eight 0 Vicryl suture was placed at the fascial edges. 4-0 subcuticular Monocryl was used at all skin edges. Dermabond was placed.  Patient tolerated the procedure well. There were no complications. He was taken to the recovery room in stable condition.                 Caroleen Hamman, MD, FACS

## 2023-01-30 ENCOUNTER — Encounter: Payer: Self-pay | Admitting: Surgery

## 2023-02-16 ENCOUNTER — Encounter: Payer: Self-pay | Admitting: Surgery

## 2023-02-18 ENCOUNTER — Other Ambulatory Visit: Payer: Self-pay

## 2023-02-18 ENCOUNTER — Encounter: Payer: Self-pay | Admitting: Surgery

## 2023-02-18 ENCOUNTER — Ambulatory Visit (INDEPENDENT_AMBULATORY_CARE_PROVIDER_SITE_OTHER): Payer: Self-pay | Admitting: Surgery

## 2023-02-18 VITALS — BP 123/76 | HR 75 | Temp 97.5°F | Ht 74.0 in | Wt 216.0 lb

## 2023-02-18 DIAGNOSIS — K4090 Unilateral inguinal hernia, without obstruction or gangrene, not specified as recurrent: Secondary | ICD-10-CM

## 2023-02-18 DIAGNOSIS — Z09 Encounter for follow-up examination after completed treatment for conditions other than malignant neoplasm: Secondary | ICD-10-CM

## 2023-02-18 NOTE — Patient Instructions (Signed)

## 2023-02-20 NOTE — Progress Notes (Signed)
22 yo s/p Rob [facility].  He has done well.  He says that [place]of[place] symptoms are improved.  No cysts fevers no chills no issues related to his recent surgery.Marland Kitchen ambulating  PE NAD Abd: soft, nt, incisions c/d/I, no infection or hernias   A/P Doing well No heavy lifting RTC prn

## 2023-06-03 ENCOUNTER — Encounter: Payer: Self-pay | Admitting: Surgery

## 2023-06-03 ENCOUNTER — Ambulatory Visit (INDEPENDENT_AMBULATORY_CARE_PROVIDER_SITE_OTHER): Payer: Self-pay | Admitting: Surgery

## 2023-06-03 VITALS — BP 115/77 | HR 80 | Temp 98.2°F | Ht 74.0 in | Wt 220.6 lb

## 2023-06-03 DIAGNOSIS — R1032 Left lower quadrant pain: Secondary | ICD-10-CM

## 2023-06-03 NOTE — Patient Instructions (Signed)
If you have any concerns or questions, please feel free to call our office.   Laparoscopic Inguinal Hernia Repair, Adult, Care After The following information offers guidance on how to care for yourself after your procedure. Your health care provider may also give you more specific instructions. If you have problems or questions, contact your health care provider. What can I expect after the procedure? After the procedure, it is common to have: Pain. Swelling and bruising around the incision area. Scrotal swelling, in males. Some fluid or blood draining from your incisions. Follow these instructions at home: Medicines Take over-the-counter and prescription medicines only as told by your health care provider. Ask your health care provider if the medicine prescribed to you: Requires you to avoid driving or using machinery. Can cause constipation. You may need to take these actions to prevent or treat constipation: Drink enough fluid to keep your urine pale yellow. Take over-the-counter or prescription medicines. Eat foods that are high in fiber, such as beans, whole grains, and fresh fruits and vegetables. Limit foods that are high in fat and processed sugars, such as fried or sweet foods. Incision care  Follow instructions from your health care provider about how to take care of your incisions. Make sure you: Wash your hands with soap and water for at least 20 seconds before and after you change your bandage (dressing). If soap and water are not available, use hand sanitizer. Change your dressing as told by your health care provider. Leave stitches (sutures), skin glue, or adhesive strips in place. These skin closures may need to stay in place for 2 weeks or longer. If adhesive strip edges start to loosen and curl up, you may trim the loose edges. Do not remove adhesive strips completely unless your health care provider tells you to do that. Check your incision area every day for signs of  infection. Check for: More redness, swelling, or pain. More fluid or blood. Warmth. Pus or a bad smell. Wear loose, soft clothing while your incisions heal. Managing pain and swelling If directed, put ice on the painful or swollen areas. To do this: Put ice in a plastic bag. Place a towel between your skin and the bag. Leave the ice on for 20 minutes, 2-3 times a day. Remove the ice if your skin turns bright red. This is very important. If you cannot feel pain, heat, or cold, you have a greater risk of damage to the area.  Activity Do not lift anything that is heavier than 10 lb (4.5 kg), or the limit that you are told, until your health care provider says that it is safe. Ask your health care provider what activities are safe for you. A lot of activity during the first week after surgery can increase pain and swelling. For 1 week after your procedure: Avoid activities that take a lot of effort, such as exercise or sports. You may walk and climb stairs as needed for daily activity, but avoid long walks or climbing stairs for exercise. General instructions If you were given a sedative during the procedure, it can affect you for several hours. Do not drive or operate machinery until your health care provider says that it is safe. Do not take baths, swim, or use a hot tub until your health care provider approves. Ask your health care provider if you may take showers. You may only be allowed to take sponge baths. Do not use any products that contain nicotine or tobacco. These products include cigarettes,  chewing tobacco, and vaping devices, such as e-cigarettes. If you need help quitting, ask your health care provider. Keep all follow-up visits. This is important. Contact a health care provider if: You have any of these signs of infection: More redness, swelling, or pain around your incisions or your groin area. More fluid or blood coming from an incision. Warmth coming from an incision. Pus  or a bad smell coming from an incision. A fever or chills. You have more swelling in your scrotum, if you are male. You have severe pain and medicines do not help. You have abdominal pain or swelling. You cannot urinate or have a bowel movement. You faint or feel dizzy. You have nausea and vomiting. Get help right away if: You have redness, warmth, or pain in your leg. You have chest pain. You have problems breathing. These symptoms may represent a serious problem that is an emergency. Do not wait to see if the symptoms will go away. Get medical help right away. Call your local emergency services (911 in the [place] Do not drive yourself to the hospital. Summary Pain, swelling, and bruising are common after the procedure. Check your incision area every day for signs of infection, such as more redness, swelling, or pain. Put ice on painful or swollen areas for 20 minutes, 2-3 times a day. This information is not intended to replace advice given to you by your health care provider. Make sure you discuss any questions you have with your health care provider. Document Revised: 07/24/2020 Document Reviewed: 07/24/2020 Elsevier Patient Education  2024 [employer].

## 2023-06-06 NOTE — Progress Notes (Signed)
Outpatient Surgical Follow Up  06/06/2023  Leon Farmer is an 22 y.o. male.   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  I also did ilioinguinal nerve block with Kenalog.  He did not respond to any of those measures.  We contemplated that this will be a sports hernia and after multiple months we decided to proceed with robotic inguinal hernia repair.  He did have the inguinal hernia repair by me 4 months ago and this was uneventful.  Now he has recurrent symptoms for the last few weeks. He endorses left inguinal pain that is intermittent sharp and that he feels that something on his groin goes in and out.  He reports that he feels that the shaft of his penis goes into the left inguinal canal. He continues to smoke and use some cannabis.  No major physical activity lately  Past Medical History:  Diagnosis Date   Asthma    has not had asthma attack since 2020   Eczema    Fracture of wrist    Inguinal hernia    left   Marijuana use     Past Surgical History:  Procedure Laterality Date   INSERTION OF MESH  01/29/2023   Procedure: INSERTION OF MESH;  Surgeon: Leon Ro, MD;  Location: [facility] ORS;  Service: General;;   NOSE SURGERY  2019    Family History  Problem Relation Age of Onset   Heart disease Other    Diabetes Other     Social History:  reports that he has been smoking cigarettes and e-cigarettes. He has never used smokeless tobacco. He reports current drug use. Drug: Marijuana. He reports that he does not drink alcohol.  Allergies:  Allergies  Allergen Reactions   Shrimp [Shellfish Allergy] Hives    Medications reviewed.    ROS Full ROS performed and is otherwise negative other than what is stated in HPI   BP 115/77   Pulse 80   Temp 98.2 F (36.8 C) (Oral)   Ht 6\' 2"   (1.88 m)   Wt 220 lb 9.6 oz (100.1 kg)   SpO2 98%   BMI 28.32 kg/m   Physical Exam Vitals and nursing note reviewed. Exam conducted with a chaperone present.  Constitutional:      General: He is not in acute distress.    Appearance: Normal appearance. He is not ill-appearing.  Abdominal:     General: Abdomen is flat. There is no distension.     Palpations: Abdomen is soft. There is no mass.     Tenderness: There is no guarding or rebound.     Hernia: No hernia is present.     Comments: The robotic incisions are healed, no evidence of incisional or inguinal hernias. He is tender on left inguinal canal  Musculoskeletal:     Cervical back: No rigidity or tenderness.  Skin:    General: Skin is warm and dry.     Capillary Refill: Capillary refill takes less than 2 seconds.  Neurological:     General: No focal deficit present.     Mental Status: He is alert and oriented to person, place, and time.  Psychiatric:        Mood and Affect: Mood normal.        Behavior: Behavior normal.  Thought Content: Thought content normal.        Judgment: Judgment normal.    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  I also did ilioinguinal nerve block with Kenalog.  He did not respond to any of those measures.  We contemplated that this will be a sports hernia and after multiple months we decided to proceed with robotic inguinal hernia repair.  He did have the inguinal hernia repair by me 4 months ago and this was uneventful.  Now he has recurrent symptoms for the last few weeks.  At this point I do recommend  ilioinguinal nerve block.  I have contacted Dr. Laban Emperor from pain management and he does this procedure.  Also discussed with him about physical therapy exercises.  Please note that I spent 40 minutes in this encounter including reviewing medical records, counseling the patient,  coordinating his care and performing appropriate documentation  [name], MD [facility] General Surgeon

## 2023-07-07 ENCOUNTER — Telehealth: Payer: Self-pay | Admitting: *Deleted

## 2023-07-07 NOTE — Telephone Encounter (Signed)
Patient called and would like to get a 2nd opinion forc hronic left inguinodynia and needs a referral to [facility]

## 2023-07-08 ENCOUNTER — Other Ambulatory Visit: Payer: Self-pay

## 2023-07-08 ENCOUNTER — Telehealth: Payer: Self-pay

## 2023-07-08 DIAGNOSIS — R1032 Left lower quadrant pain: Secondary | ICD-10-CM

## 2023-07-08 IMAGING — CT CT NECK W/ CM
5 of 6 series · 14 of 33 positions shown, 16 images · IV contrast (APPLIED)
Comparison: None.

CLINICAL DATA: Sore throat pain with swallowing

EXAM:
CT NECK WITH CONTRAST
TECHNIQUE: Multidetector CT imaging of the neck was performed using the
standard protocol following the bolus administration of intravenous
contrast.

[Series 2: axial neck · axial · 0.61mm/px · z∈[-212,-116]mm · 2 of 145 slices shown, 3 images]
[im 49/145  soft-tissue]
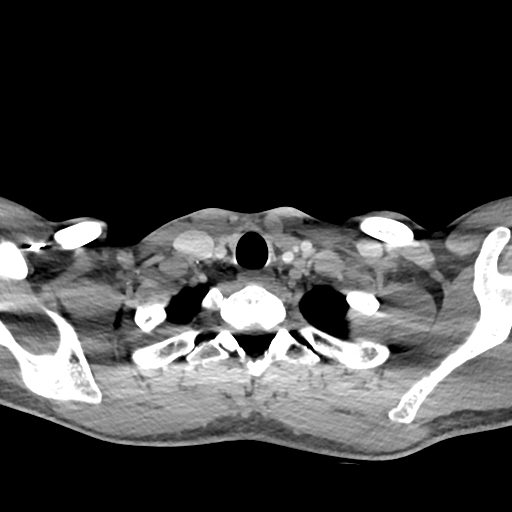
[im 49/145  bone]
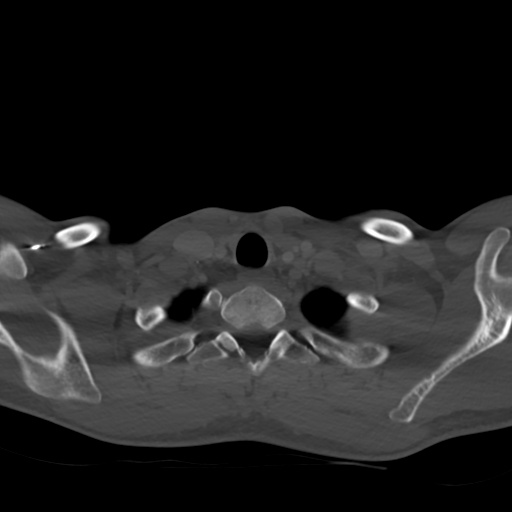
[im 97/145  bone]
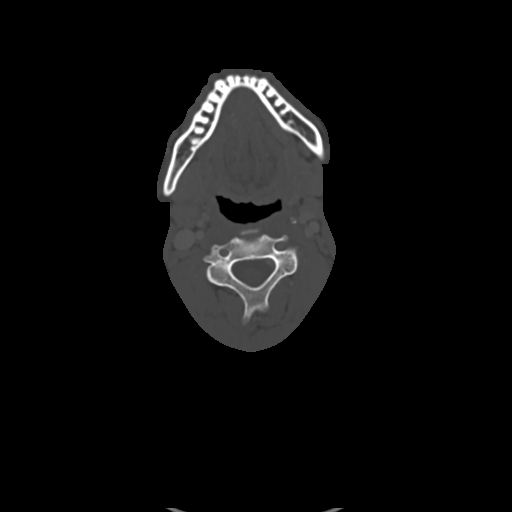

[Series 4: axial bone · axial · 0.61mm/px · z∈[-212,-116]mm · 2 of 145 slices shown]
[im 49/145  bone]
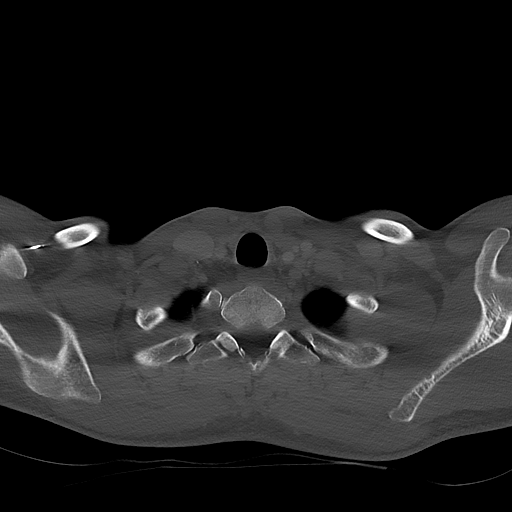
[im 97/145  bone]
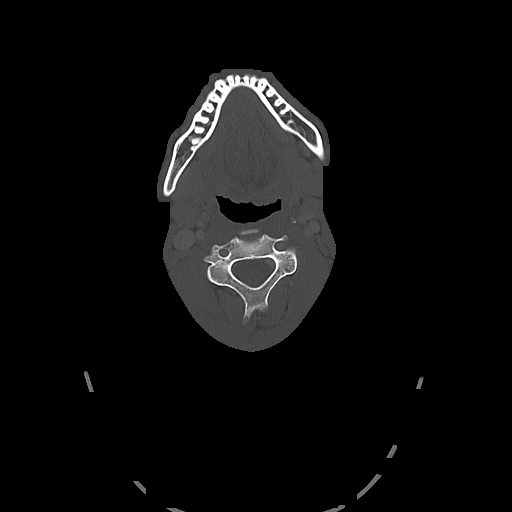

[Series 5: sag neck · sagittal · 0.59mm/px · 5 of 99 slices shown, 6 images]
[im 33/99  bone]
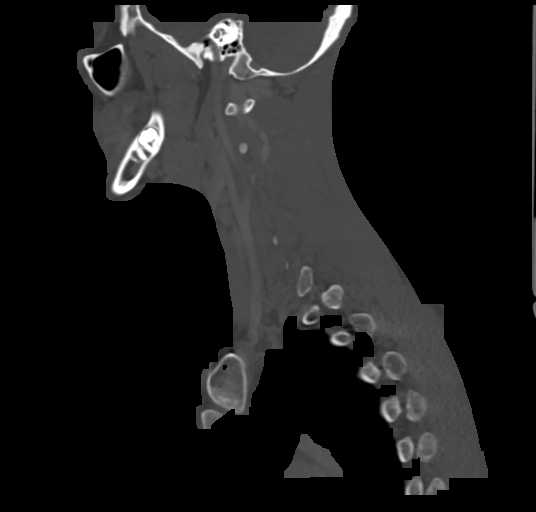
[im 41/99  bone]
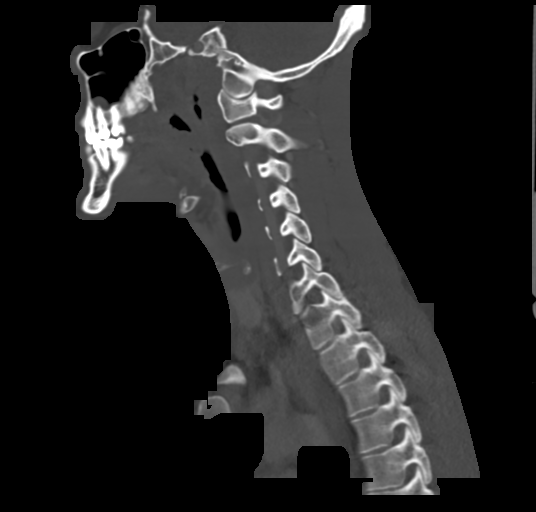
[im 50/99  soft-tissue]
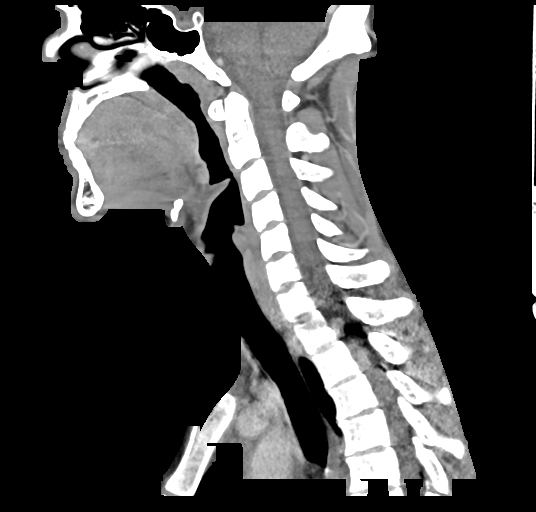
[im 50/99  bone]
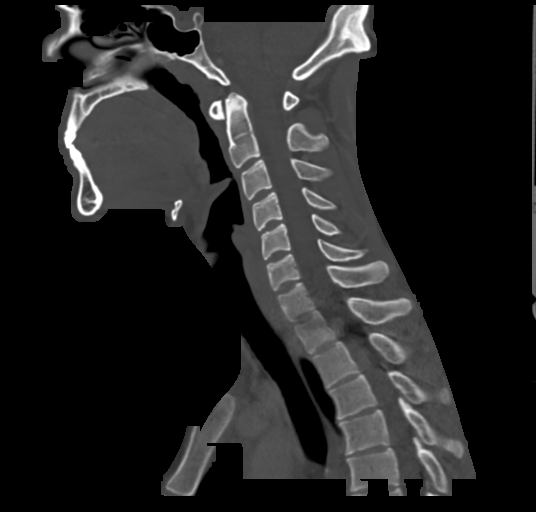
[im 58/99  bone]
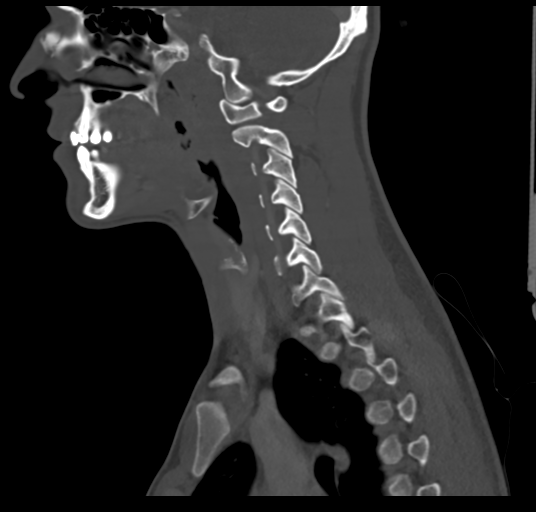
[im 66/99  bone]
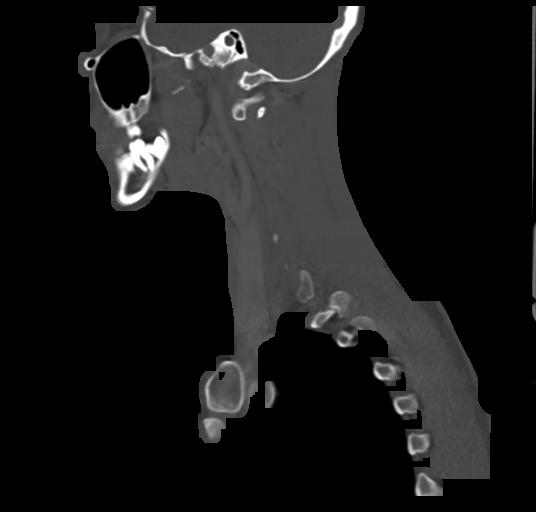

[Series 6: cor neck · coronal · 0.51mm/px · 3 of 138 slices shown]
[im 28/138  bone]
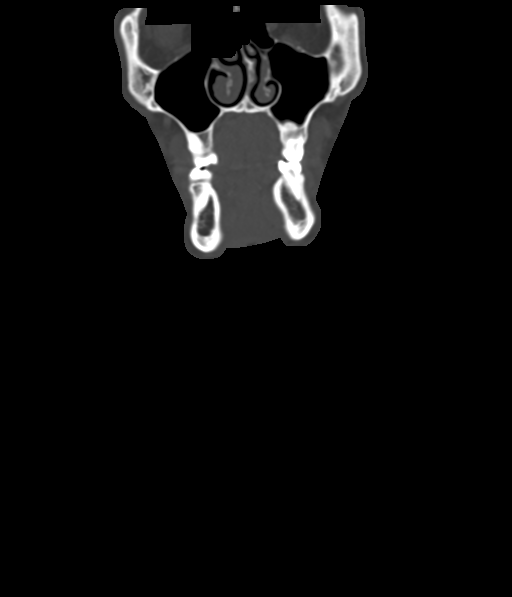
[im 55/138  bone]
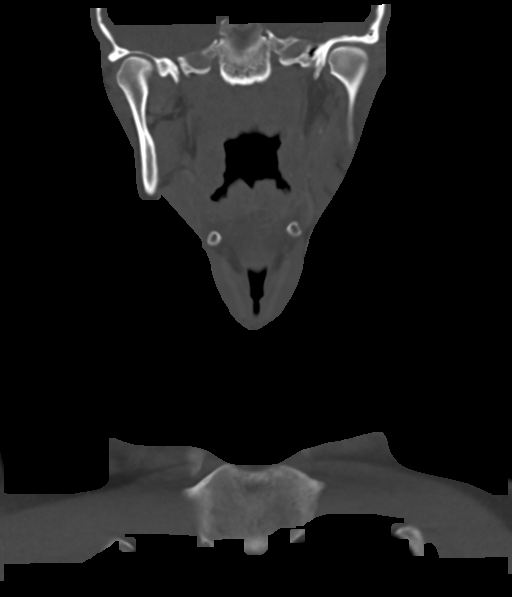
[im 83/138  bone]
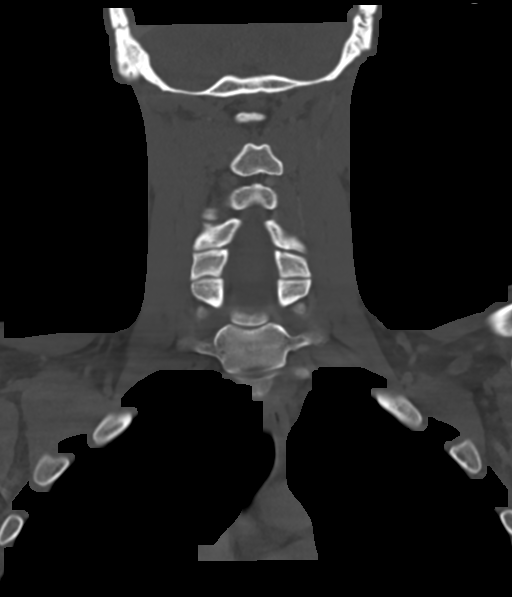

[Series 7: ax oropharynx · axial · 0.52mm/px · z∈[-242,-143]mm · 2 of 151 slices shown]
[im 51/151  bone]
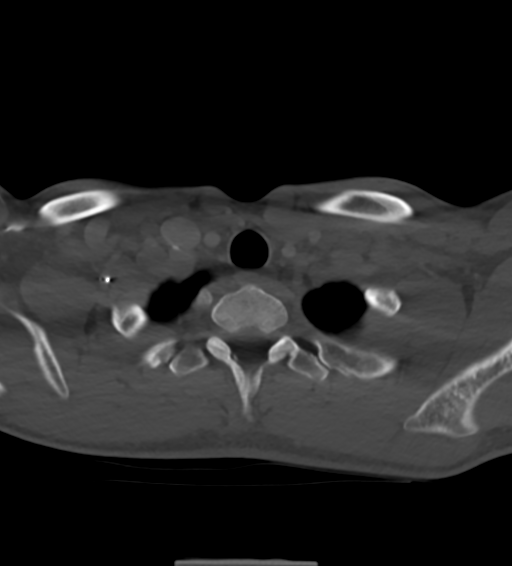
[im 101/151  bone]
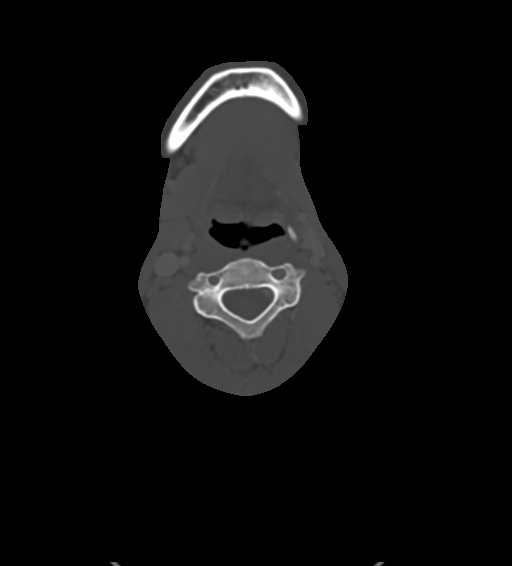

[14 of 33 positions shown; findings below may reference images not displayed]

RADIATION DOSE REDUCTION: This exam was performed according to the
departmental dose-optimization program which includes automated
exposure control, adjustment of the mA and/or kV according to
patient size and/or use of iterative reconstruction technique.

CONTRAST:  75mL OMNIPAQUE IOHEXOL 300 MG/ML  SOLN
FINDINGS: Pharynx and larynx: The nasal cavity and nasopharynx are
unremarkable.

There is a punctate left palatine tonsilliths. The palatine tonsils
are otherwise unremarkable. The parapharyngeal spaces are clear. The
tongue is unremarkable. There is a 0.9 cm AP x 0.7 cm TV focus of
peripheral enhancement in the pre epiglottic fat (2-57). There is no
mass effect or surrounding inflammatory change.

The hypopharynx and larynx are unremarkable. The vocal folds are
normal.

There is no retropharyngeal fluid collection. There is no evidence
of tonsillar abscess. Airway is patent throughout.

Salivary glands: The parotid and submandibular glands are
unremarkable.

Thyroid: Unremarkable.

Lymph nodes: There is no pathologic lymphadenopathy in the neck.

Vascular: The major vessels of the neck are unremarkable.

Limited intracranial: The imaged intracranial compartment is
unremarkable.

Visualized orbits: The imaged globes and orbits are unremarkable.

Mastoids and visualized paranasal sinuses: There is mild mucosal
thickening in the maxillary sinuses. The mastoid air cells are
clear.

Skeleton: The bones are unremarkable. There is no acute osseous
abnormality or aggressive osseous lesion.

Upper chest: The imaged lung apices are clear.

Other: None.
IMPRESSION: 1. No CT evidence of tonsillitis or tonsillar abscess. The airway is
patent throughout.
2. 0.9 cm x 0.7 cm peripherally enhancing hypodense lesion in the
pre epiglottic fat may reflect a small thyroglossal duct cyst.
Infection of the cyst can not be entirely excluded, but there is no
mass effect or surrounding inflammatory change.

## 2023-07-08 NOTE — Telephone Encounter (Signed)
Referral faxed to [facility] at [phone].

## 2024-03-24 ENCOUNTER — Other Ambulatory Visit: Payer: Self-pay | Admitting: Surgery

## 2024-03-24 DIAGNOSIS — R1031 Right lower quadrant pain: Secondary | ICD-10-CM

## 2024-04-19 ENCOUNTER — Other Ambulatory Visit: Payer: Self-pay

## 2024-05-16 ENCOUNTER — Other Ambulatory Visit: Payer: Self-pay

## 2024-05-17 ENCOUNTER — Other Ambulatory Visit: Payer: Self-pay

## 2024-07-06 ENCOUNTER — Other Ambulatory Visit: Payer: Self-pay

## 2024-07-12 ENCOUNTER — Other Ambulatory Visit: Payer: Self-pay

## 2024-07-26 ENCOUNTER — Ambulatory Visit
Admission: RE | Admit: 2024-07-26 | Discharge: 2024-07-26 | Disposition: A | Discharge status: Home / Self Care | Payer: Self-pay | Source: Ambulatory Visit | Attending: Surgery

## 2024-07-26 DIAGNOSIS — R1031 Right lower quadrant pain: Secondary | ICD-10-CM

## 2024-08-11 ENCOUNTER — Ambulatory Visit: Payer: Self-pay | Admitting: Surgery
